# Patient Record
Sex: Male | Born: 1985 | Race: White | Hispanic: No | Marital: Married | Smoking: Current every day smoker
Health system: Southern US, Community
[De-identification: ages and names within clinical notes are randomized; demographics above are authoritative.]

---

## 2014-04-20 ENCOUNTER — Emergency Department: Payer: Self-pay | Admitting: Emergency Medicine

## 2014-12-22 ENCOUNTER — Emergency Department: Payer: Self-pay | Admitting: Emergency Medicine

## 2014-12-26 ENCOUNTER — Emergency Department (HOSPITAL_COMMUNITY)
Admission: EM | Admit: 2014-12-26 | Discharge: 2014-12-26 | Disposition: A | Discharge status: Home / Self Care | Payer: Self-pay | Attending: Emergency Medicine | Admitting: Emergency Medicine

## 2014-12-26 ENCOUNTER — Encounter (HOSPITAL_COMMUNITY): Payer: Self-pay | Admitting: Neurology

## 2014-12-26 DIAGNOSIS — K2970 Gastritis, unspecified, without bleeding: Secondary | ICD-10-CM | POA: Insufficient documentation

## 2014-12-26 DIAGNOSIS — Z720 Tobacco use: Secondary | ICD-10-CM | POA: Insufficient documentation

## 2014-12-26 DIAGNOSIS — R062 Wheezing: Secondary | ICD-10-CM | POA: Insufficient documentation

## 2014-12-26 LAB — URINALYSIS, ROUTINE W REFLEX MICROSCOPIC
Bilirubin Urine: NEGATIVE
GLUCOSE, UA: NEGATIVE mg/dL
Hgb urine dipstick: NEGATIVE
Ketones, ur: NEGATIVE mg/dL
Leukocytes, UA: NEGATIVE
NITRITE: NEGATIVE
Protein, ur: NEGATIVE mg/dL
Specific Gravity, Urine: 1.03 (ref 1.005–1.030)
Urobilinogen, UA: 1 mg/dL (ref 0.0–1.0)
pH: 6.5 (ref 5.0–8.0)

## 2014-12-26 LAB — CBC WITH DIFFERENTIAL/PLATELET
BASOS ABS: 0 10*3/uL (ref 0.0–0.1)
Basophils Relative: 0 % (ref 0–1)
Eosinophils Absolute: 0.3 10*3/uL (ref 0.0–0.7)
Eosinophils Relative: 3 % (ref 0–5)
HCT: 48.7 % (ref 39.0–52.0)
Hemoglobin: 16.7 g/dL (ref 13.0–17.0)
LYMPHS PCT: 24 % (ref 12–46)
Lymphs Abs: 2.6 10*3/uL (ref 0.7–4.0)
MCH: 29.5 pg (ref 26.0–34.0)
MCHC: 34.3 g/dL (ref 30.0–36.0)
MCV: 85.9 fL (ref 78.0–100.0)
Monocytes Absolute: 1 10*3/uL (ref 0.1–1.0)
Monocytes Relative: 10 % (ref 3–12)
Neutro Abs: 6.9 10*3/uL (ref 1.7–7.7)
Neutrophils Relative %: 63 % (ref 43–77)
Platelets: 161 10*3/uL (ref 150–400)
RBC: 5.67 MIL/uL (ref 4.22–5.81)
RDW: 13 % (ref 11.5–15.5)
WBC: 10.8 10*3/uL — AB (ref 4.0–10.5)

## 2014-12-26 LAB — COMPREHENSIVE METABOLIC PANEL
ALBUMIN: 3.8 g/dL (ref 3.5–5.2)
ALT: 60 U/L — AB (ref 0–53)
AST: 32 U/L (ref 0–37)
Alkaline Phosphatase: 66 U/L (ref 39–117)
Anion gap: 8 (ref 5–15)
BUN: 13 mg/dL (ref 6–23)
CO2: 29 mmol/L (ref 19–32)
Calcium: 9.3 mg/dL (ref 8.4–10.5)
Chloride: 104 mmol/L (ref 96–112)
Creatinine, Ser: 0.93 mg/dL (ref 0.50–1.35)
GFR calc Af Amer: >90 mL/min (ref >90)
Glucose, Bld: 99 mg/dL (ref 70–99)
Potassium: 3.8 mmol/L (ref 3.5–5.1)
SODIUM: 141 mmol/L (ref 135–145)
TOTAL PROTEIN: 7 g/dL (ref 6.0–8.3)
Total Bilirubin: 0.7 mg/dL (ref 0.3–1.2)

## 2014-12-26 LAB — LIPASE, BLOOD: LIPASE: 25 U/L (ref 11–59)

## 2014-12-26 MED ORDER — OMEPRAZOLE 20 MG PO CPDR: 20.0000 mg | DELAYED_RELEASE_CAPSULE | Freq: Every day | ORAL | Status: DC

## 2014-12-26 MED ORDER — ONDANSETRON HCL 4 MG PO TABS: 4.0000 mg | ORAL_TABLET | Freq: Four times a day (QID) | ORAL | Status: DC

## 2014-12-26 MED ORDER — IPRATROPIUM BROMIDE 0.02 % IN SOLN
0.5000 mg | Freq: Once | RESPIRATORY_TRACT | Status: AC
  Administered 2014-12-26: 0.5 mg via RESPIRATORY_TRACT
  Filled 2014-12-26: qty 2.5

## 2014-12-26 MED ORDER — PANTOPRAZOLE SODIUM 40 MG IV SOLR
40.0000 mg | INTRAVENOUS | Status: AC
  Administered 2014-12-26: 40 mg via INTRAVENOUS
  Filled 2014-12-26: qty 40

## 2014-12-26 MED ORDER — ALBUTEROL SULFATE (2.5 MG/3ML) 0.083% IN NEBU
5.0000 mg | INHALATION_SOLUTION | Freq: Once | RESPIRATORY_TRACT | Status: AC
  Administered 2014-12-26: 5 mg via RESPIRATORY_TRACT
  Filled 2014-12-26: qty 6

## 2014-12-26 MED ORDER — DIPHENHYDRAMINE HCL 50 MG/ML IJ SOLN
25.0000 mg | Freq: Once | INTRAMUSCULAR | Status: AC
  Administered 2014-12-26: 25 mg via INTRAVENOUS
  Filled 2014-12-26: qty 1

## 2014-12-26 MED ORDER — KETOROLAC TROMETHAMINE 30 MG/ML IJ SOLN
30.0000 mg | Freq: Once | INTRAMUSCULAR | Status: AC
  Administered 2014-12-26: 30 mg via INTRAVENOUS
  Filled 2014-12-26: qty 1

## 2014-12-26 MED ORDER — PROCHLORPERAZINE EDISYLATE 5 MG/ML IJ SOLN
10.0000 mg | Freq: Once | INTRAMUSCULAR | Status: AC
  Administered 2014-12-26: 10 mg via INTRAVENOUS
  Filled 2014-12-26: qty 2

## 2014-12-26 MED ORDER — SODIUM CHLORIDE 0.9 % IV BOLUS (SEPSIS)
1000.0000 mL | INTRAVENOUS | Status: AC
  Administered 2014-12-26: 1000 mL via INTRAVENOUS

## 2014-12-26 NOTE — ED Notes (Addendum)
NP Tysinger at bedside.

## 2014-12-26 NOTE — Discharge Instructions (Signed)
Please follow the directions provided. Use the resource guide or the referral given to establish care with a primary care doctor. Take the omeprazole daily to help prevent this pain returning.  You may take it up to twice a day.  Use the zofran as needed for nausea.  Don't hesitate to return for any new, worsening or concerning symptoms.     SEEK IMMEDIATE MEDICAL CARE IF:  You have black or dark red stools.  You vomit blood or material that looks like coffee grounds.  You are unable to keep fluids down.  Your abdominal pain gets worse.  You have a fever.  You do not feel better after 1 week.  You have any other questions or concerns.   Emergency Department Resource Guide 1) Find a Doctor and Pay Out of Pocket Although you won't have to find out who is covered by your insurance plan, it is a good idea to ask around and get recommendations. You will then need to call the office and see if the doctor you have chosen will accept you as a new patient and what types of options they offer for patients who are self-pay. Some doctors offer discounts or will set up payment plans for their patients who do not have insurance, but you will need to ask so you aren't surprised when you get to your appointment.  2) Contact Your Local Health Department Not all health departments have doctors that can see patients for sick visits, but many do, so it is worth a call to see if yours does. If you don't know where your local health department is, you can check in your phone book. The CDC also has a tool to help you locate your state's health department, and many state websites also have listings of all of their local health departments.  3) Find a Walk-in Clinic If your illness is not likely to be very severe or complicated, you may want to try a walk in clinic. These are popping up all over the country in pharmacies, drugstores, and shopping centers. They're usually staffed by nurse practitioners or physician  assistants that have been trained to treat common illnesses and complaints. They're usually fairly quick and inexpensive. However, if you have serious medical issues or chronic medical problems, these are probably not your best option.  No Primary Care Doctor: - Call Health Connect at  [phone] - they can help you locate a primary care doctor that  accepts your insurance, provides certain services, etc. - Physician Referral Service- [phone]  Chronic Pain Problems: Organization         Address  Phone   Notes  [facility]  [phone] Patients need to be referred by their primary care doctor.   Medication Assistance: Organization         Address  Phone   Notes  [facility] Medication [facility] [address] [place], [place] [address] [phone] --Must be a resident of [facility] -- Must have NO insurance coverage whatsoever (no Medicaid/ Medicare, etc.) -- The pt. MUST have a primary care doctor that directs their care regularly and follows them in the community   [facility]  [phone]   [employer]  [phone]    Agencies that provide inexpensive medical care: Organization         Address  Phone   Notes  [facility]  [phone]   [facility]    (  [phone]) [phone]   [facility] [address] [place], [place] [address] [phone]) [phone]   [facility] [address], [place] [phone](336) [phone]   Planned Parenthood    [phone](336) [phone]   [facility]    [phone]   [facility] and [facility]  [address], [place] Phone:  [phone](336) [phone], Fax:  [phone]) [phone] Hours of Operation:  9 am - 6 pm, M-F.  Also accepts Medicaid/Medicare and self-pay.  [facility]  [address], [place] Phone: [phone](336) [phone], Fax: [phone]) [phone]. Hours of Operation:  8:30 am - 5:30 pm, M-F.  Also accepts  Medicaid and self-pay.  [facility] [place] [address], [place] Phone: [phone](336) [phone]   [facility] [address], [place], [place] [phone], Ext. [phone] Mondays & Thursdays: 7-9 AM.  First 15 patients are seen on a first come, first serve basis.    Medicaid-accepting [facility] Providers:  Organization         Address  Phone   Notes  [facility] [address], [place] [phone]) [phone] Also accepts self-pay patients.  [facility] [address], [place]  [phone]) [phone]   [facility] [address], [place] [phone]) [phone]   [facility] [address] [place] [address], [place] [phone](336) [phone]   Renaye RakersVeita Bland [address], [place]   [phone]) [phone] Only accepts [place] Access [place] patients after they have their name applied to their card.   Self-Pay (no insurance) in [facility]:  Organization         Address  Phone   Notes  Sickle Cell Patients, [facility] [address], [place] [phone]) [phone]   [facility] [address], [place] [phone]   [facility]  [address], [place] [phone]) [phone]   [facility]/Dr. Osei-Bonsu  [address] [place] [address], [place] or [phone], [place] [phone]) [phone] Phone number for both [place] and [place] locations is the same.  [facility] [address], [place] [phone](336) [phone]   [facility]  [address] [place] [address], [place] or [address] [phone]) [phone]   [facility] [address], [place] [phone](336) [phone], phone; [phone](336) [phone], fax Sees patients 1st and 3rd Saturday of every month.  Must not qualify for public or private insurance (i.e. Medicaid, Medicare, [place] Health Choice, Veterans' Benefits)  Household  income should be no more than 200% of the poverty level The clinic cannot treat you if you are pregnant or think you are pregnant  Sexually transmitted diseases are not treated at the clinic.    Dental Care: Organization         Address  Phone  Notes  [facility] [address], [place] [phone]) [phone] Accepts children up to age 29 who are enrolled in [place] or [place] Health Choice; pregnant women with a Medicaid card; and children who have applied for Medicaid or  Health Choice, but were declined, whose parents can pay a reduced fee at time of service.  [facility] [facility] of [facility] [place]  [address], [place] [phone]) [phone] Accepts children up  to age 59 who are enrolled in Medicaid or [place] Health Choice; pregnant women with a Medicaid card; and children who have applied for Medicaid or [place] Health Choice, but were declined, whose parents can pay a reduced fee at time of service.  [facility]  [address] [place], [place] [phone] Patients are seen by appointment only. Walk-ins are not accepted. [facility] will see patients 72 years of age and older. Monday - Tuesday (8am-5pm) Most Wednesdays (8:30-5pm) $30 per visit, cash only  [facility]  [address], [facility] [phone] Patients are seen by appointment only. Walk-ins are not accepted. [facility] will see patients 39 years of age and older. One Wednesday Evening (Monthly: Volunteer Based).  $30 per visit, cash only  [employer]  [phone] for adults; Children under age 60, call Graduate Pediatric Dentistry at [phone]. Children aged 93-14, please call [phone] to request a pediatric application.  Dental services are provided in all areas of dental care including fillings, crowns and bridges, complete and partial dentures, implants, gum  treatment, root canals, and extractions. Preventive care is also provided. Treatment is provided to both adults and children. Patients are selected via a lottery and there is often a waiting list.   [facility] [address], [place]  [phone] [email or web address]   [facility] [address] [place], [place] [phone], Ext. [phone] Second and Fourth Thursday of each month, opens at 6:30 AM; Clinic ends at 9 AM.  Patients are seen on a first-come first-served basis, and a limited number are seen during each clinic.   [facility]  [address] [place], [place] [phone]   Eligibility Requirements You must have lived in [place], [place], or [place] counties for at least the last three months.   You cannot be eligible for state or federal sponsored [place], including [employer], [place], or [employer].   You generally cannot be eligible for healthcare insurance through your employer.    How to apply: Eligibility screenings are held every Tuesday and Wednesday afternoon from 1:00 pm until 4:00 pm. You do not need an appointment for the interview!  [facility] [address], [place], [place] [phone]   [facility]  [phone]   [facility]  [phone]   [facility] Health Department  [phone]    Behavioral Health Resources in the Community: Intensive Outpatient Programs Organization         Address  Phone  Notes  [facility] [address] [place], [place] [phone]   [facility] Outpatient [address], [place], [place] [phone]   [facility] [address] [place], [place]  [phone]   [facility] [address]  [place], [place] [phone] or [phone]   Substance Abuse Resources Organization         Address  Phone  Notes  Alcohol and  Drug Services  [phone]   [facility]  [phone]   [facility] [place]  [phone]   Floydene Flock  [phone]   Residential & Outpatient Substance Abuse Program  5107733744   Psychological Services Organization         Address  Phone  Notes  [facility]  [phone]   [place] [facility]  [phone]   [facility]  [address], [place] [phone] or [phone]    Mobile Crisis Teams Organization         Address  Phone  Notes  [facility], Mobile Crisis Care Unit  [phone]   [facility]  [address] [place], [place] [phone]   Doristine Locks [address] [place] [phone]    Self-Help/Support Groups Organization         Address  Phone             Notes  [facility] [place] - variety of support groups  336- I7437963 Call for more information  Narcotics Anonymous (NA), [facility] [address], [employer] [place]  2 meetings at this location   Statistician         Address  Phone  Notes  [facility] [address],    [place]  [phone]   [facility]  [address], [place] [address], [place], [place] [phone]   [facility] [address] [place], [place] [phone] Admissions: 8am-3pm M-F  [facility] [address]    [place], [place] [phone]   [facility] [address] [place], [place], [place] [phone]   [facility] [address],  [place], [place] [phone]   [facility] - Intensive Outpatient [address], [place], [place] [phone]   [facility] ([facility]iction Recovery [facility] [address]  [place], [place] [phone] or [phone]   [facility] ([facility]) [address], [place], [place] [phone] Accepts Medicaid  [facility]  [place] [address],  [place] [phone] Substance Abuse/Addiction Treatment   [facility] Organization         Address  Phone  Notes  [facility]  [phone]   Angie Fava, PhD [address]vin Knack [place], [place]   [phone] or [phone]   [facility]   [address] [place], [place] [phone]   [facility] [address], [place], [place] [phone] Insurance/Medicaid/sponsorship through [facility] [address]                                    [place], [place] [phone] Therapy/tele-psych/case  [facility] [address] [place], [place] [phone]    Dr. Lolly Mustache  [phone]   [facility]  [employer] [facility] Dept. 1) [address] [place] 2) [address], [place] 3)  [address], [place] [phone]  [phone]   [facility] Child Abuse Hotline 404-433-1481 or 9560193813 (After Hours)

## 2014-12-26 NOTE — ED Notes (Signed)
Pt c/o epigastric pain for 1 week intermittently; n/v, no diarrhea. Dx with acid reflux at [place]. Pt is a x 4

## 2014-12-26 NOTE — ED Provider Notes (Signed)
CSN: [id number]     Arrival date & time 12/26/14  0815 History   First MD Initiated Contact with Patient 12/26/14 [phone]     Chief Complaint  Patient presents with  . Abdominal Pain   (Consider location/radiation/quality/duration/timing/severity/associated sxs/prior Treatment) HPI  Brandon Cruz is a 29 yo male presenting with report of epigastric pain and vomiting.  Pt states his symptoms began 1 week ago when he woke up at 4 am with vomiting followed subsequently by the epigastric abd pain.  He reports vomiting appr 20 times the first day followed continued pain and vomiting until 2 days ago.  His vomiting has stopped but his abd pain continues. He rates his pain 8/10 and describes it as a burning sensation that does not radiate.  He reports drinking 2- 6 packs of beer every weekend and smokes 1 ppd of cigarettes. He denies any fever, chills, bilious/bloody emesis or diarrhea.    History reviewed. No pertinent past medical history. History reviewed. No pertinent past surgical history. No family history on file. History  Substance Use Topics  . Smoking status: Current Every Day Smoker  . Smokeless tobacco: Not on file  . Alcohol Use: Yes    Review of Systems  Constitutional: Negative for fever and chills.  HENT: Negative for sore throat.   Eyes: Negative for visual disturbance.  Respiratory: Negative for cough and shortness of breath.   Cardiovascular: Negative for chest pain and leg swelling.  Gastrointestinal: Positive for nausea, vomiting and abdominal pain. Negative for diarrhea.  Genitourinary: Negative for dysuria.  Musculoskeletal: Negative for myalgias.  Skin: Negative for rash.  Neurological: Negative for weakness, numbness and headaches.      Allergies  Review of patient's allergies indicates no known allergies.  Home Medications   Prior to Admission medications   Not on File   BP 103/60 mmHg  Pulse 66  Temp(Src) 97.6 F (36.4 C) (Oral)  Resp 19  Ht 5\' 9"   (1.753 m)  Wt 240 lb (108.863 kg)  BMI 35.43 kg/m2  SpO2 95% Physical Exam  Constitutional: He appears well-developed and well-nourished. No distress.  HENT:  Head: Normocephalic and atraumatic.  Mouth/Throat: Oropharynx is clear and moist. No oropharyngeal exudate.  Eyes: Conjunctivae are normal.  Neck: Neck supple. No thyromegaly present.  Cardiovascular: Normal rate, regular rhythm and intact distal pulses.   Pulmonary/Chest: Effort normal. No respiratory distress. He has no decreased breath sounds. He has wheezes in the right middle field, the right lower field, the left middle field and the left lower field. He has no rhonchi. He has no rales. He exhibits no tenderness.  Abdominal: Soft. He exhibits no distension and no mass. There is no hepatosplenomegaly. There is tenderness in the epigastric area. There is no rigidity, no rebound, no guarding, no CVA tenderness, no tenderness at McBurney's point and negative Murphy's sign.    Musculoskeletal: He exhibits no tenderness.  Lymphadenopathy:    He has no cervical adenopathy.  Neurological: He is alert.  Skin: Skin is warm and dry. No rash noted. He is not diaphoretic.  Psychiatric: He has a normal mood and affect.  Nursing note and vitals reviewed.   ED Course  Procedures (including critical care time) Labs Review Labs Reviewed  CBC WITH DIFFERENTIAL/PLATELET - Abnormal; Notable for the following:    WBC 10.8 (*)    All other components within normal limits  COMPREHENSIVE METABOLIC PANEL - Abnormal; Notable for the following:    ALT 60 (*)  All other components within normal limits  URINALYSIS, ROUTINE W REFLEX MICROSCOPIC - Abnormal; Notable for the following:    Color, Urine AMBER (*)    All other components within normal limits  LIPASE, BLOOD    Imaging Review No results found.   EKG Interpretation None      MDM   Final diagnoses:  Gastritis   29 yo with symptoms epigastric pain and vomiting, symptoms  consistent with gastritis.  Vitals are stable, no fever. Labs negative for significant abnormality.  Pt's pain and nausea resolved after NS bolus, toradol for pain, IV protonix and nausea meds. Pt tolerating PO fluids.  Discussed starting PPI daily and resources to establish care with a PCP.  Supportive therapy indicated with return if symptoms worsen.  Pt is well-appearing, in no acute distress and vital signs reviewed and not concerning. He appears safe to be discharged.  Return precautions provided.  Pt aware of plan and in agreement.     Filed Vitals:   12/26/14 1000 12/26/14 1015 12/26/14 1030 12/26/14 1045  BP: 112/63 104/74 108/69   Pulse:  52    Temp:    98 F (36.7 C)  TempSrc:    Oral  Resp: Height:      Weight:      SpO2: 99% 98% 100%    Meds given in ED:  Medications  sodium chloride 0.9 % bolus 1,000 mL (0 mLs Intravenous Stopped 12/26/14 1040)  ketorolac (TORADOL) 30 MG/ML injection 30 mg (30 mg Intravenous Given 12/26/14 0927)  diphenhydrAMINE (BENADRYL) injection 25 mg (25 mg Intravenous Given 12/26/14 0928)  prochlorperazine (COMPAZINE) injection 10 mg (10 mg Intravenous Given 12/26/14 0930)  pantoprazole (PROTONIX) injection 40 mg (40 mg Intravenous Given 12/26/14 0925)  albuterol (PROVENTIL) (2.5 MG/3ML) 0.083% nebulizer solution 5 mg (5 mg Nebulization Given 12/26/14 0936)  ipratropium (ATROVENT) nebulizer solution 0.5 mg (0.5 mg Nebulization Given 12/26/14 0936)    12/26/14 0000  omeprazole (PRILOSEC) 20 MG capsule Daily Discontinue Reprint 12/26/14 1110   12/26/14 0000  ondansetron (ZOFRAN) 4 MG tablet Every 6 hours Discontinue Reprint 12/26/14 [address] NP 12/26/14 2321  Azalia Bilis, MD 12/28/14 [phone]

## 2015-04-20 ENCOUNTER — Emergency Department
Admission: EM | Admit: 2015-04-20 | Discharge: 2015-04-20 | Disposition: A | Discharge status: Home / Self Care | Payer: BLUE CROSS/BLUE SHIELD | Attending: Emergency Medicine | Admitting: Emergency Medicine

## 2015-04-20 DIAGNOSIS — Z720 Tobacco use: Secondary | ICD-10-CM | POA: Diagnosis not present

## 2015-04-20 DIAGNOSIS — J029 Acute pharyngitis, unspecified: Secondary | ICD-10-CM | POA: Diagnosis present

## 2015-04-20 DIAGNOSIS — Z79899 Other long term (current) drug therapy: Secondary | ICD-10-CM | POA: Insufficient documentation

## 2015-04-20 DIAGNOSIS — J020 Streptococcal pharyngitis: Secondary | ICD-10-CM | POA: Diagnosis not present

## 2015-04-20 MED ORDER — ACETAMINOPHEN-CODEINE 120-12 MG/5ML PO SUSP: 10.0000 mL | Freq: Four times a day (QID) | ORAL | Status: DC | PRN

## 2015-04-20 MED ORDER — ACETAMINOPHEN-CODEINE 120-12 MG/5ML PO SOLN
10.0000 mL | Freq: Once | ORAL | Status: AC
  Administered 2015-04-20: 10 mL via ORAL

## 2015-04-20 MED ORDER — ACETAMINOPHEN-CODEINE 120-12 MG/5ML PO SOLN
ORAL | Status: AC
  Administered 2015-04-20: 10 mL via ORAL
  Filled 2015-04-20: qty 2

## 2015-04-20 MED ORDER — PENICILLIN G BENZATHINE 1200000 UNIT/2ML IM SUSP
1.2000 10*6.[IU] | Freq: Once | INTRAMUSCULAR | Status: AC
  Administered 2015-04-20: 1.2 10*6.[IU] via INTRAMUSCULAR
  Filled 2015-04-20: qty 2

## 2015-04-20 NOTE — ED Provider Notes (Signed)
[facility] Emergency Department Provider Note  ____________________________________________  Time seen: Approximately 8:40 PM  I have reviewed the triage vital signs and the nursing notes.   HISTORY  Chief Complaint Sore Throat   HPI Brandon Cruz is a 29 y.o. male who presents to the emergency department for sore throat that started on Monday and has gotten much worse over the past 2 days. He c/o pain with swallowing and chills. No confirmed fever.   No past medical history on file.  There are no active problems to display for this patient.   No past surgical history on file.  Current Outpatient Rx  Name  Route  Sig  Dispense  Refill  . acetaminophen-codeine 120-12 MG/5ML suspension   Oral   Take 10 mLs by mouth every 6 (six) hours as needed for pain.   120 mL   0   . omeprazole (PRILOSEC) 20 MG capsule   Oral   Take 1 capsule (20 mg total) by mouth daily.   30 capsule   0   . ondansetron (ZOFRAN) 4 MG tablet   Oral   Take 1 tablet (4 mg total) by mouth every 6 (six) hours.   12 tablet   0     Allergies Review of patient's allergies indicates no known allergies.  No family history on file.  Social History History  Substance Use Topics  . Smoking status: Current Every Day Smoker  . Smokeless tobacco: Not on file  . Alcohol Use: Yes    Review of Systems Constitutional:Feverno Eyes: No visual changes. ENT: Sore throat.yes, Difficulty Swallowing yes Respiratory: Denies shortness of breath. Gastrointestinal: No abdominal pain.  No nausea, no vomiting.  No diarrhea. Genitourinary: Negative for dysuria. Musculoskeletal:Generalized body aches: no Skin: Rash: no  Neurological: Negative for headaches, focal weakness or numbness.  10-point ROS otherwise negative.  ____________________________________________   PHYSICAL EXAM:  VITAL SIGNS: ED Triage Vitals  Enc Vitals Group     BP 04/20/15 2018 120/81 mmHg     Pulse Rate  04/20/15 2018 100     Resp 04/20/15 2018 18     Temp 04/20/15 2018 98.4 F (36.9 C)     Temp Source 04/20/15 2018 Oral     SpO2 04/20/15 2018 96 %     Weight 04/20/15 2018 235 lb (106.595 kg)     Height 04/20/15 2018  (1.753 m)     Head Cir --      Peak Flow --      Pain Score 04/20/15 2020 4     Pain Loc --      Pain Edu? --      Excl. in GC? --     Constitutional: Alert and oriented. Well appearing and in no acute distress. Eyes: Conjunctivae are normal. PERRL. EOMI. Head: Atraumatic. Nose: No congestion/rhinnorhea. Mouth/Throat: Mucous membranes are moist.  Oropharynx erythematous with tonsillar exudate and purulent pustules.  Neck: No stridor.  Lymphatic: Lymphadenopathy: no Cardiovascular: Normal rate, regular rhythm. Good peripheral circulation. Respiratory: Normal respiratory effort. Lungs CTAB. Gastrointestinal: Soft and nontender. Musculoskeletal: No lower extremity tenderness nor edema.   Neurologic:  Normal speech and language. No gross focal neurologic deficits are appreciated. Speech is normal. No gait instability. Skin:  Skin is warm, dry and intact. No rash noted Psychiatric: Mood and affect are normal. Speech and behavior are normal.  ____________________________________________   LABS (all labs ordered are listed, but only abnormal results are displayed)  Labs Reviewed - No data  to display ____________________________________________  EKG   ____________________________________________  RADIOLOGY   ____________________________________________   PROCEDURES  Procedure(s) performed: None  Critical Care performed: No  ____________________________________________   INITIAL IMPRESSION / ASSESSMENT AND PLAN / ED COURSE  Pertinent labs & imaging results that were available during my care of the patient were reviewed by me and considered in my medical decision making (see chart for details).  Patient elected to take Bicillin injection. Will  also give Tylenol #3 suspension for pain. He was advised to follow up with ENT for symptoms not improving over the next 2-3 days. ____________________________________________   FINAL CLINICAL IMPRESSION(S) / ED DIAGNOSES  Final diagnoses:  Acute streptococcal pharyngitis     Chinita PesterCari B [name], FNP 04/20/15 2120  Darien Ramusavid W Kaminski, MD 04/20/15 2358

## 2015-04-20 NOTE — ED Notes (Signed)
Pt states sore throat for 2 days, pt states is painful to swallow, pt with strong cough noted in triage. Pt states has had chills.

## 2015-04-20 NOTE — ED Notes (Signed)
Patient with no complaints at this time. Respirations even and unlabored. Skin warm/dry. Discharge instructions reviewed with patient at this time. Patient given opportunity to voice concerns/ask questions. Patient discharged at this time and left Emergency Department with steady gait.   

## 2015-04-21 ENCOUNTER — Emergency Department
Admission: EM | Admit: 2015-04-21 | Discharge: 2015-04-21 | Disposition: A | Discharge status: Home / Self Care | Payer: BLUE CROSS/BLUE SHIELD | Attending: Emergency Medicine | Admitting: Emergency Medicine

## 2015-04-21 ENCOUNTER — Encounter: Payer: Self-pay | Admitting: Emergency Medicine

## 2015-04-21 ENCOUNTER — Emergency Department: Payer: BLUE CROSS/BLUE SHIELD

## 2015-04-21 DIAGNOSIS — J36 Peritonsillar abscess: Secondary | ICD-10-CM | POA: Insufficient documentation

## 2015-04-21 DIAGNOSIS — Z79899 Other long term (current) drug therapy: Secondary | ICD-10-CM | POA: Insufficient documentation

## 2015-04-21 DIAGNOSIS — Z720 Tobacco use: Secondary | ICD-10-CM | POA: Diagnosis not present

## 2015-04-21 DIAGNOSIS — J0300 Acute streptococcal tonsillitis, unspecified: Secondary | ICD-10-CM

## 2015-04-21 DIAGNOSIS — J029 Acute pharyngitis, unspecified: Secondary | ICD-10-CM | POA: Diagnosis present

## 2015-04-21 DIAGNOSIS — J039 Acute tonsillitis, unspecified: Secondary | ICD-10-CM

## 2015-04-21 LAB — BASIC METABOLIC PANEL
ANION GAP: 12 (ref 5–15)
BUN: 10 mg/dL (ref 6–20)
CO2: 25 mmol/L (ref 22–32)
Calcium: 8.9 mg/dL (ref 8.9–10.3)
Chloride: 98 mmol/L — ABNORMAL LOW (ref 101–111)
Creatinine, Ser: 0.78 mg/dL (ref 0.61–1.24)
Glucose, Bld: 132 mg/dL — ABNORMAL HIGH (ref 65–99)
POTASSIUM: 3.4 mmol/L — AB (ref 3.5–5.1)
SODIUM: 135 mmol/L (ref 135–145)

## 2015-04-21 LAB — CBC
HCT: 46.3 % (ref 40.0–52.0)
HEMOGLOBIN: 16 g/dL (ref 13.0–18.0)
MCH: 28.8 pg (ref 26.0–34.0)
MCHC: 34.6 g/dL (ref 32.0–36.0)
MCV: 83.4 fL (ref 80.0–100.0)
Platelets: 138 10*3/uL — ABNORMAL LOW (ref 150–440)
RBC: 5.55 MIL/uL (ref 4.40–5.90)
RDW: 13.1 % (ref 11.5–14.5)
WBC: 14.9 10*3/uL — ABNORMAL HIGH (ref 3.8–10.6)

## 2015-04-21 IMAGING — CT CT NECK W/ CM
4 of 5 series · 14 of 33 positions shown, 16 images · IV contrast (omnipaque)
Comparison: None.

CLINICAL DATA: Sore throat x5 days

EXAM:
CT NECK WITH CONTRAST
TECHNIQUE: Multidetector CT imaging of the neck was performed using the
standard protocol following the bolus administration of intravenous
contrast.
CONTRAST:  75mL OMNIPAQUE IOHEXOL 300 MG/ML  SOLN

[Series 2: axial neck · axial · 0.65mm/px · z∈[-224,-60]mm · 4 of 138 slices shown, 5 images]
[im 28/138  soft-tissue]
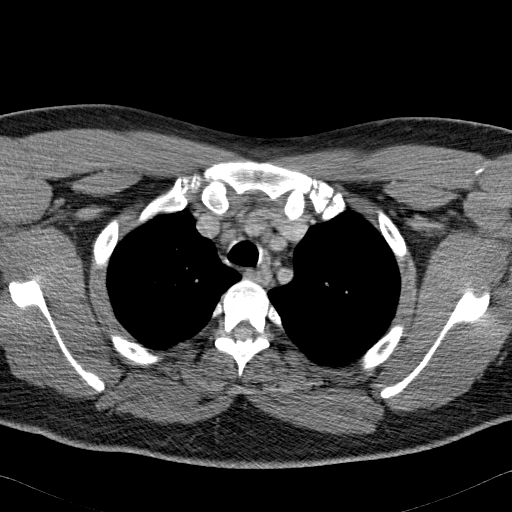
[im 28/138  bone]
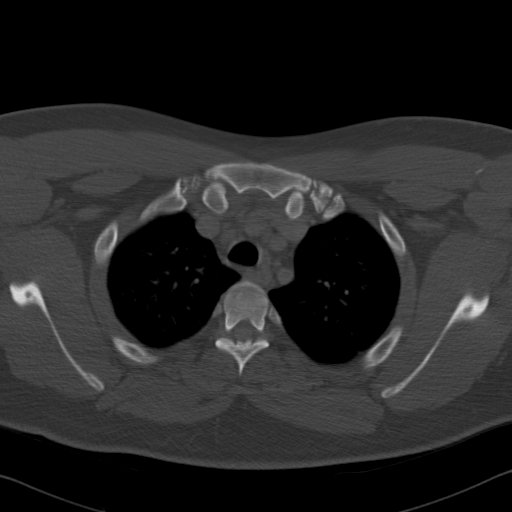
[im 55/138  bone]
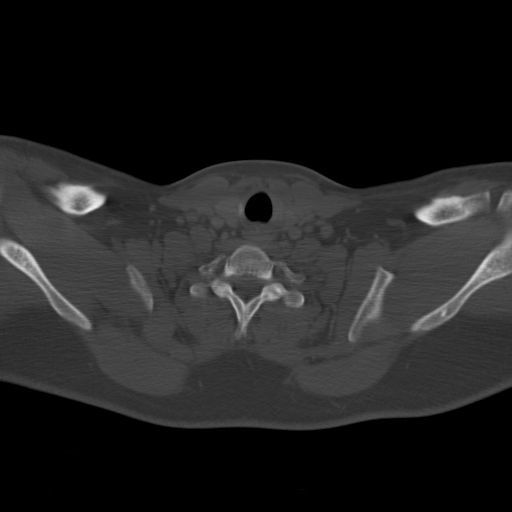
[im 83/138  bone]
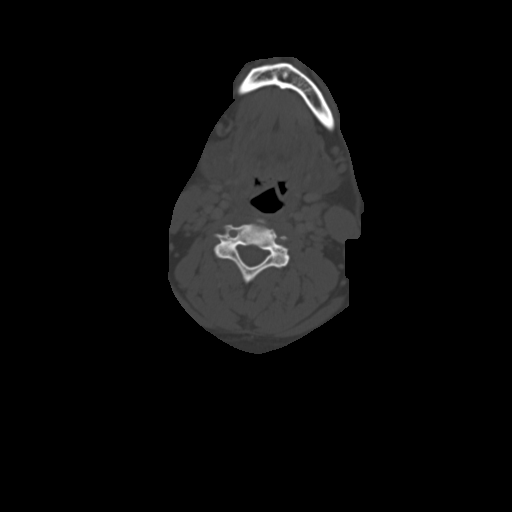
[im 110/138  bone]
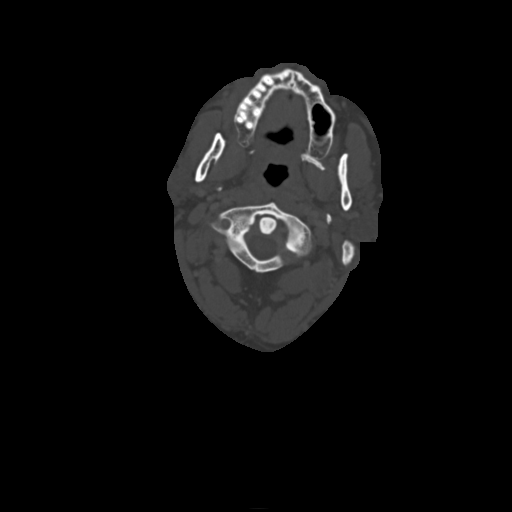

[Series 4: sag neck · sagittal · 0.46mm/px · 5 of 120 slices shown, 6 images]
[im 40/120  bone]
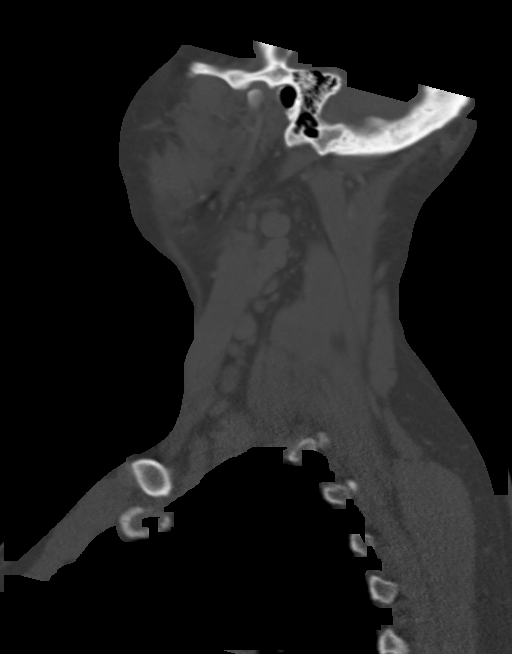
[im 50/120  bone]
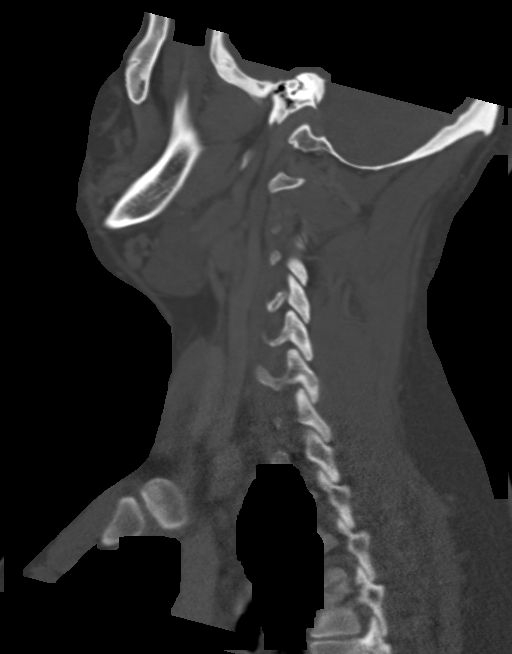
[im 60/120  soft-tissue]
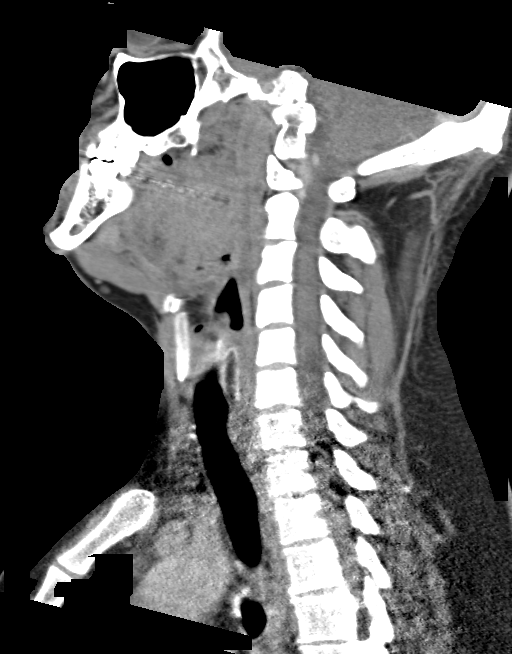
[im 60/120  bone]
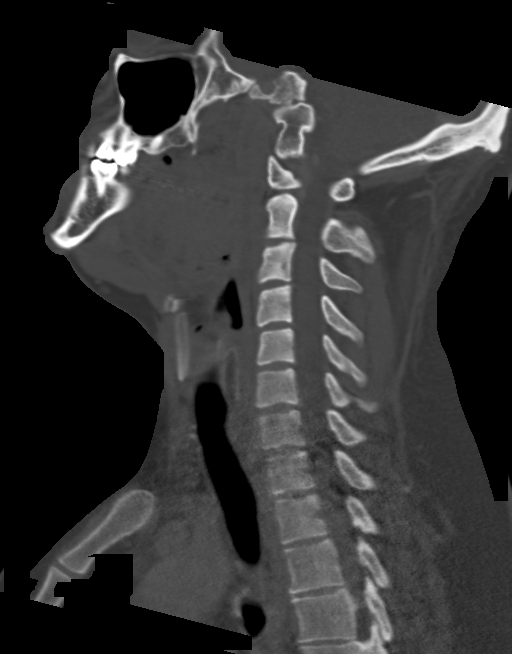
[im 70/120  bone]
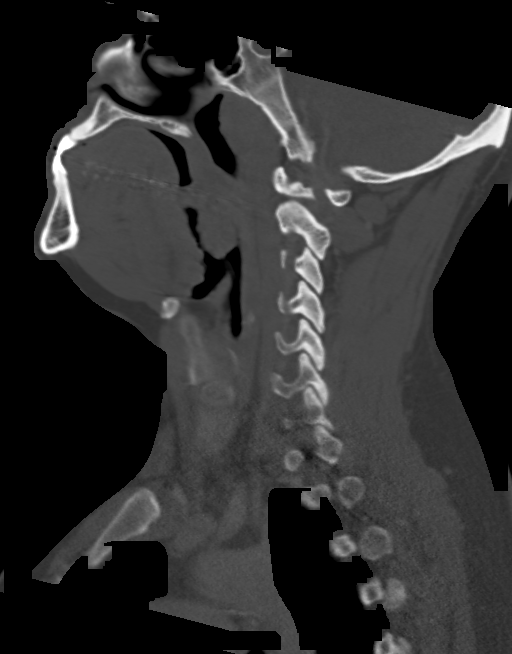
[im 80/120  bone]
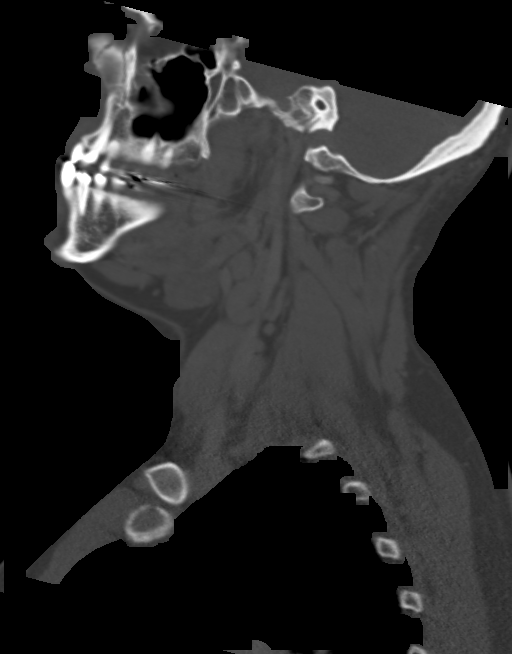

[Series 6: ax oropharynx · axial · 0.45mm/px · z∈[-246,-193]mm · 2 of 143 slices shown]
[im 29/143  bone]
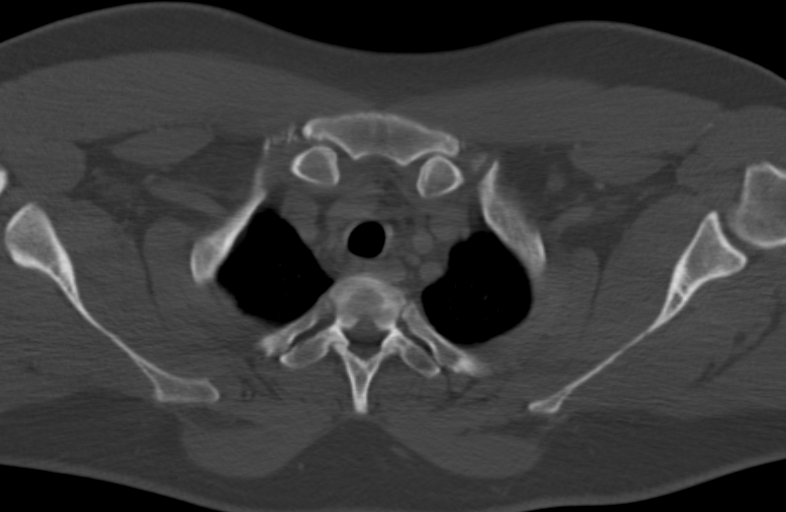
[im 57/143  bone]
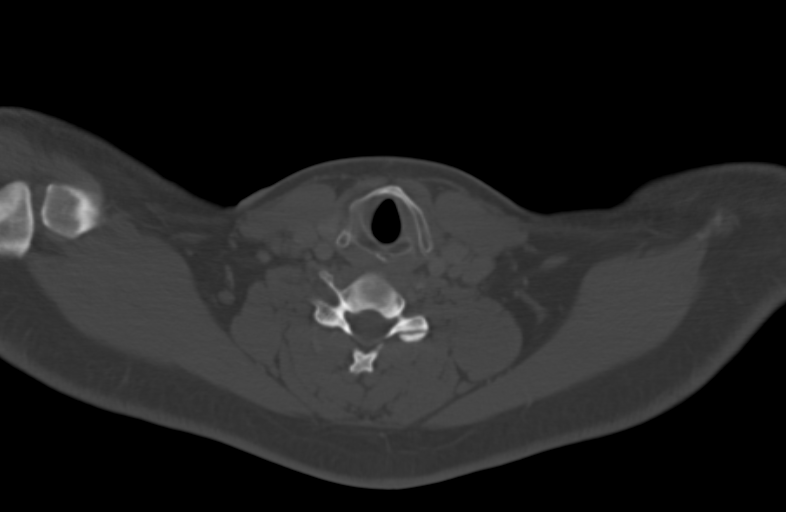

[Series 7: cor neck · coronal · 0.57mm/px · 3 of 107 slices shown]
[im 27/107  bone]
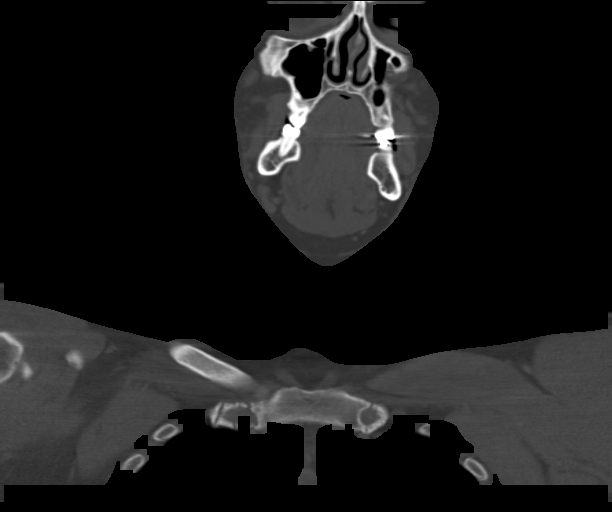
[im 45/107  bone]
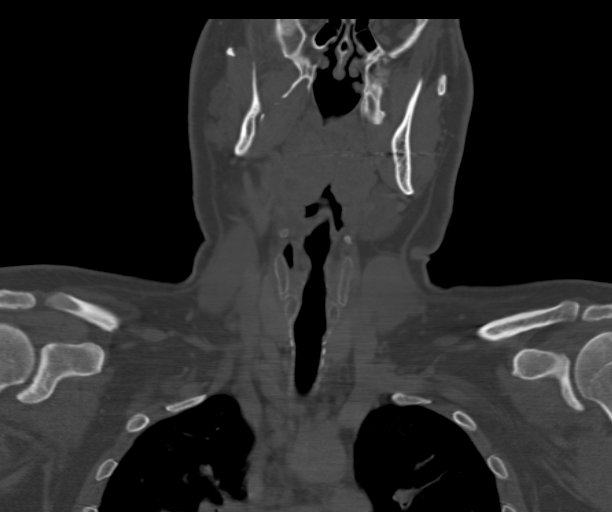
[im 63/107  bone]
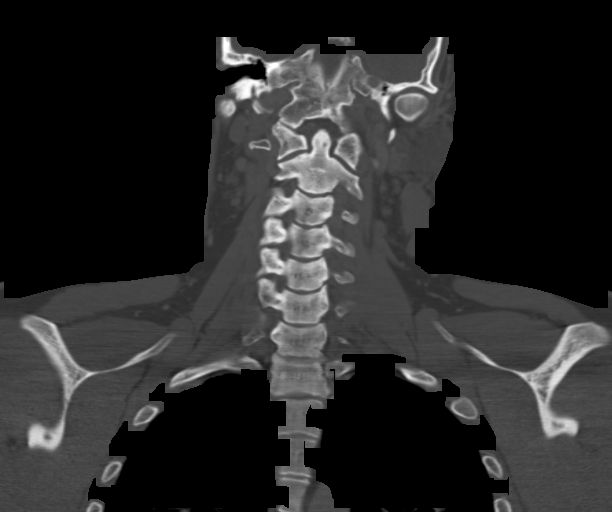

[14 of 33 positions shown; findings below may reference images not displayed]

FINDINGS: Pharynx and larynx: Enlargement/edema of the bilateral tonsils.

9 x 11 mm early/developing right peritonsillar abscess (series 2/
image 46).

Nasopharyngeal airway is narrowed but patent.

Salivary glands: Within normal limits.

Thyroid: Within normal limits.

Lymph nodes: Enlarged bilateral cervical lymphadenopathy, measuring
up to 16 mm on the right and 13 mm on the left, likely reactive.

Vascular: Within normal limits.

Limited intracranial: Within normal limits.

Visualized orbits: Visualize globes and retroconal soft tissues are
within normal limits.

Mastoids and visualized paranasal sinuses: Visualized paranasal
sinuses and mastoid air cells are clear.

Skeleton: Cervical spine is within normal limits.

Upper chest: Visualized lung apices are clear.
IMPRESSION: 9 x 11 mm early/developing right peritonsillar abscess.

## 2015-04-21 MED ORDER — CLINDAMYCIN PALMITATE HCL 75 MG/5ML PO SOLR
300.0000 mg | Freq: Once | ORAL | Status: DC
  Filled 2015-04-21: qty 20

## 2015-04-21 MED ORDER — DEXAMETHASONE SODIUM PHOSPHATE 10 MG/ML IJ SOLN
10.0000 mg | Freq: Once | INTRAMUSCULAR | Status: AC
  Administered 2015-04-21: 10 mg via INTRAVENOUS
  Filled 2015-04-21: qty 1

## 2015-04-21 MED ORDER — AMOXICILLIN 500 MG PO TABS: 1000.0000 mg | ORAL_TABLET | Freq: Three times a day (TID) | ORAL | Status: DC

## 2015-04-21 MED ORDER — LIDOCAINE VISCOUS 2 % MT SOLN: 20.0000 mL | OROMUCOSAL | Status: DC | PRN

## 2015-04-21 MED ORDER — CEFTRIAXONE SODIUM IN DEXTROSE 40 MG/ML IV SOLN
2.0000 g | Freq: Once | INTRAVENOUS | Status: AC
  Administered 2015-04-21: 2 g via INTRAVENOUS
  Filled 2015-04-21: qty 50

## 2015-04-21 MED ORDER — ACETAMINOPHEN 160 MG/5ML PO SUSP
ORAL | Status: AC
  Filled 2015-04-21: qty 20

## 2015-04-21 MED ORDER — LIDOCAINE VISCOUS 2 % MT SOLN
15.0000 mL | Freq: Once | OROMUCOSAL | Status: AC
  Administered 2015-04-21: 15 mL via OROMUCOSAL
  Filled 2015-04-21: qty 15

## 2015-04-21 MED ORDER — IOHEXOL 300 MG/ML  SOLN
75.0000 mL | Freq: Once | INTRAMUSCULAR | Status: AC | PRN
  Administered 2015-04-21: 75 mL via INTRAVENOUS

## 2015-04-21 MED ORDER — SODIUM CHLORIDE 0.9 % IV SOLN
Freq: Once | INTRAVENOUS | Status: AC
  Administered 2015-04-21: 19:00:00 via INTRAVENOUS

## 2015-04-21 MED ORDER — ACETAMINOPHEN 160 MG/5ML PO SOLN
650.0000 mg | Freq: Once | ORAL | Status: AC
  Administered 2015-04-21: 650 mg via ORAL
  Filled 2015-04-21: qty 20.3

## 2015-04-21 MED ORDER — SODIUM CHLORIDE 0.9 % IV SOLN
3.0000 g | Freq: Once | INTRAVENOUS | Status: AC
  Administered 2015-04-21: 3 g via INTRAVENOUS
  Filled 2015-04-21: qty 3

## 2015-04-21 MED ORDER — PREDNISONE 10 MG (48) PO TBPK: ORAL_TABLET | Freq: Every day | ORAL | Status: DC

## 2015-04-21 NOTE — ED Provider Notes (Signed)
[facility] Emergency Department Provider Note  ____________________________________________  Time seen: Approximately 6:45 PM  I have reviewed the triage vital signs and the nursing notes.   HISTORY  Chief Complaint Sore Throat    HPI Brandon Cruz is a 29 y.o. male presents to the ER for the complaint of continued sore throat. Patient reports that he has a sore throat for approximate 5 days. Patient reports that he was seen in the ER yesterday and was positive for strep throat and received a Bicillin shot. Patient reports that he presents to the ER today as he is unable to swallow foods and liquids at home. Patient reports intermittent fever. Patient states that bilateral sides of throat hurts. Denies other pain or other complaints. 8 sore throat is currently 8 out of 10 pain and hurts to swallow.     History reviewed. No pertinent past medical history.  There are no active problems to display for this patient.   History reviewed. No pertinent past surgical history.  Current Outpatient Rx  Name  Route  Sig  Dispense  Refill  . acetaminophen-codeine 120-12 MG/5ML suspension   Oral   Take 10 mLs by mouth every 6 (six) hours as needed for pain.   120 mL   0   . omeprazole (PRILOSEC) 20 MG capsule   Oral   Take 1 capsule (20 mg total) by mouth daily.   30 capsule   0   . ondansetron (ZOFRAN) 4 MG tablet   Oral   Take 1 tablet (4 mg total) by mouth every 6 (six) hours.   12 tablet   0     Allergies Review of patient's allergies indicates no known allergies.  History reviewed. No pertinent family history.  Social History History  Substance Use Topics  . Smoking status: Current Every Day Smoker  . Smokeless tobacco: Not on file  . Alcohol Use: Yes    Review of Systems Constitutional: positive fever/chills Eyes: No visual changes. ENT: positive sore throat. Cardiovascular: Denies chest pain. Respiratory: Denies shortness of  breath. Gastrointestinal: No abdominal pain.  No nausea, no vomiting.  No diarrhea.  No constipation. Genitourinary: Negative for dysuria. Musculoskeletal: Negative for back pain. Skin: Negative for rash. Neurological: Negative for headaches, focal weakness or numbness.  10-point ROS otherwise negative.  ____________________________________________   PHYSICAL EXAM:  VITAL SIGNS: ED Triage Vitals  Enc Vitals Group     BP 04/21/15 1551 120/72 mmHg     Pulse Rate 04/21/15 1551 103     Resp 04/21/15 1551 18     Temp 04/21/15 1551 99.1 F (37.3 C)     Temp Source 04/21/15 1551 Oral     SpO2 04/21/15 1551 94 %     Weight 04/21/15 1551 230 lb (104.327 kg)     Height 04/21/15 1551  (1.753 m)     Head Cir --      Peak Flow --      Pain Score 04/21/15 1552 10     Pain Loc --      Pain Edu? --      Excl. in GC? --    Blood pressure 126/79, pulse 99, temperature 100.5 F (38.1 C), temperature source Oral, resp. rate 18, height  (1.753 m), weight 230 lb (104.327 kg), SpO2 94 %.   Constitutional: Alert and oriented. Well appearing and in no acute distress. Eyes: Conjunctivae are normal. PERRL. EOMI. Head: Atraumatic. Nose: No congestion/rhinnorhea. Ears: no erythema, normal TMs.  Mouth/Throat: Mucous membranes are mildly dry.Lips moist. Mod pharyngeal erythema. Bilateral tonsillar exudate. Left 3+, right 4+. Uvular deviation to left. No drooling.  Neck: No stridor.  No cervical spine tenderness to palpation. Hematological/Lymphatic/Immunilogical: anterior cervical lymphadenopathy. Cardiovascular: Normal rate, regular rhythm. Grossly normal heart sounds.  Good peripheral circulation. Respiratory: Normal respiratory effort.  No retractions. Lungs CTAB. Gastrointestinal: Soft and nontender. No distention. No abdominal bruits. No CVA tenderness. Musculoskeletal: No lower extremity tenderness nor edema.  No joint effusions. Neurologic:  Normal speech and language. No gross  focal neurologic deficits are appreciated. No gait instability. Skin:  Skin is warm, dry and intact. No rash noted. Psychiatric: Mood and affect are normal. Speech and behavior are normal.  ____________________________________________   LABS (all labs ordered are listed, but only abnormal results are displayed)  Labs Reviewed  BASIC METABOLIC PANEL - Abnormal; Notable for the following:    Potassium 3.4 (*)    Chloride 98 (*)    Glucose, Bld 132 (*)    All other components within normal limits  CBC - Abnormal; Notable for the following:    WBC 14.9 (*)    Platelets 138 (*)    All other components within normal limits  CULTURE, BLOOD (ROUTINE X 2)  CULTURE, BLOOD (ROUTINE X 2)   ______________________________________   INITIAL IMPRESSION / ASSESSMENT AND PLAN / ED COURSE  Pertinent labs & imaging results that were available during my care of the patient were reviewed by me and considered in my medical decision making (see chart for details).  Patient presents to the ER for complaints of sore throat which has worsened. Patient reports that he was seen in the ER yesterday and positive for strep throat and received antibiotic shot. Presents today with difficulty eating and drinking. Patient with bilateral exudative tonsils and bilateral swelling. Patient's right tonsil larger. Concern for right tonsillar abscess. We'll evaluate by CT. Discussed this with patient and significant other. Will start normal saline and 10 mg IV Decadron.  Care transferred to Dr. Scotty CourtStafford. Patient moved to room 14. Dr. Scotty CourtStafford to resume care, monitor and follow-up with patient. Patient and family verbalized understanding and agreed to plan.  ____________________________________________   FINAL CLINICAL IMPRESSION(S) / ED DIAGNOSES  Final diagnoses:  Streptococcal tonsillitis  Exudative tonsillitis      Renford DillsLindsey Lindey Renzulli, NP 04/21/15 1857  Sharman CheekPhillip Stafford, MD 04/21/15 2054

## 2015-04-21 NOTE — ED Notes (Signed)
Patient reports he was here last night for strep throat returns today with report increasing soreness to throat, unable to swallow medications.

## 2015-04-21 NOTE — ED Notes (Signed)
Pt at CT at this time.

## 2015-04-21 NOTE — Discharge Instructions (Signed)
Pharyngitis Pharyngitis is redness, pain, and swelling (inflammation) of your pharynx.  CAUSES  Pharyngitis is usually caused by infection. Most of the time, these infections are from viruses (viral) and are part of a cold. However, sometimes pharyngitis is caused by bacteria (bacterial). Pharyngitis can also be caused by allergies. Viral pharyngitis may be spread from person to person by coughing, sneezing, and personal items or utensils (cups, forks, spoons, toothbrushes). Bacterial pharyngitis may be spread from person to person by more intimate contact, such as kissing.  SIGNS AND SYMPTOMS  Symptoms of pharyngitis include:   Sore throat.   Tiredness (fatigue).   Low-grade fever.   Headache.  Joint pain and muscle aches.  Skin rashes.  Swollen lymph nodes.  Plaque-like film on throat or tonsils (often seen with bacterial pharyngitis). DIAGNOSIS  Your health care provider will ask you questions about your illness and your symptoms. Your medical history, along with a physical exam, is often all that is needed to diagnose pharyngitis. Sometimes, a rapid strep test is done. Other lab tests may also be done, depending on the suspected cause.  TREATMENT  Viral pharyngitis will usually get better in 3-4 days without the use of medicine. Bacterial pharyngitis is treated with medicines that kill germs (antibiotics).  HOME CARE INSTRUCTIONS   Drink enough water and fluids to keep your urine clear or pale yellow.   Only take over-the-counter or prescription medicines as directed by your health care provider:   If you are prescribed antibiotics, make sure you finish them even if you start to feel better.   Do not take aspirin.   Get lots of rest.   Gargle with 8 oz of salt water ( tsp of salt per 1 qt of water) as often as every 1-2 hours to soothe your throat.   Throat lozenges (if you are not at risk for choking) or sprays may be used to soothe your throat. SEEK MEDICAL  CARE IF:   You have large, tender lumps in your neck.  You have a rash.  You cough up green, yellow-brown, or bloody spit. SEEK IMMEDIATE MEDICAL CARE IF:   Your neck becomes stiff.  You drool or are unable to swallow liquids.  You vomit or are unable to keep medicines or liquids down.  You have severe pain that does not go away with the use of recommended medicines.  You have trouble breathing (not caused by a stuffy nose). MAKE SURE YOU:   Understand these instructions.  Will watch your condition.  Will get help right away if you are not doing well or get worse. Document Released: 09/22/2005 Document Revised: 07/13/2013 Document Reviewed: 05/30/2013 [facility] Patient Information 2015 [place], [place]. This information is not intended to replace advice given to you by your health care provider. Make sure you discuss any questions you have with your health care provider.  Peritonsillar Abscess A peritonsillar abscess is a collection of pus located in the back of the throat behind the tonsils. It usually occurs when a streptococcal infection of the throat or tonsils spreads into the space around the tonsils. They are almost always caused by the streptococcal germ (bacteria). The treatment of a peritonsillar abscess is most often drainage accomplished by putting a needle into the abscess or cutting (incising) and draining the abscess. This is most often followed with a course of antibiotics. HOME CARE INSTRUCTIONS  If your abscess was drained by your caregiver today, rinse your throat (gargle) with warm salt water four times per day  or as needed for comfort. Do not swallow this mixture. Mix 1 teaspoon of salt in 8 ounces of warm water for gargling.  Rest in bed as needed. Resume activities as able.  Apply cold to your neck for pain relief. Fill a plastic bag with ice and wrap it in a towel. Hold the ice on your neck for 20 minutes 4 times per day.  Eat a soft or liquid diet as  tolerated while your throat remains sore. Popsicles and ice cream may be good early choices. Drinking plenty of cold fluids will probably be soothing and help take swelling down in between the warm gargles.  Only take over-the-counter or prescription medicines for pain, discomfort, or fever as directed by your caregiver. Do not use aspirin unless directed by your physician. Aspirin slows down the clotting process. It can also cause bleeding from the drainage area if this was needled or incised today.  If antibiotics were prescribed, take them as directed for the full course of the prescription. Even if you feel you are well, you need to take them. SEEK MEDICAL CARE IF:   You have increased pain, swelling, redness, or drainage in your throat.  You develop signs of infection such as dizziness, headache, lethargy, or generalized feelings of illness.  You have difficulty breathing, swallowing or eating.  You show signs of becoming dehydrated (lightheadedness when standing, decreased urine output, a fast heart rate, or dry mouth and mucous membranes). SEEK IMMEDIATE MEDICAL CARE IF:   You have a fever.  You are coughing up or vomiting blood.  You develop more severe throat pain uncontrolled with medicines or you start to drool.  You develop difficulty breathing, talking, or find it easier to breathe while leaning forward. Document Released: 09/22/2005 Document Revised: 12/15/2011 Document Reviewed: 05/05/2008 [facility] Patient Information 2015 [place]. This information is not intended to replace advice given to you by your health care provider. Make sure you discuss any questions you have with your health care provider.   Your CT scan today shows you have a small peritonsillar abscess. We gave you IV antibiotics in the ER. It is not large enough to require surgical drainage at this time. Use viscous lidocaine as needed for symptom relief. Take the steroids and antibiotics as prescribed  to control this infection. If you have any worsening, called the ENT clinic who can see you right away and determine if drainage is needed. If you're unable to follow up in ENT clinic, return to the ER right away.

## 2015-04-21 NOTE — ED Notes (Signed)
Labs reviewed with MD, OK for patient to go to flex.

## 2015-04-21 NOTE — ED Notes (Signed)
Will start Rocephin once Unasyn completes. Pt verbalized understanding, no further needs at this time.

## 2015-04-23 LAB — POCT RAPID STREP A: Streptococcus, Group A Screen (Direct): POSITIVE — AB

## 2015-04-27 LAB — CULTURE, BLOOD (ROUTINE X 2)
Culture: NO GROWTH
Culture: NO GROWTH

## 2015-12-17 ENCOUNTER — Encounter: Payer: Self-pay | Admitting: *Deleted

## 2015-12-17 ENCOUNTER — Emergency Department
Admission: EM | Admit: 2015-12-17 | Discharge: 2015-12-17 | Disposition: A | Discharge status: Home / Self Care | Payer: BLUE CROSS/BLUE SHIELD | Attending: Emergency Medicine | Admitting: Emergency Medicine

## 2015-12-17 DIAGNOSIS — M62838 Other muscle spasm: Secondary | ICD-10-CM | POA: Diagnosis not present

## 2015-12-17 DIAGNOSIS — F17200 Nicotine dependence, unspecified, uncomplicated: Secondary | ICD-10-CM | POA: Diagnosis not present

## 2015-12-17 DIAGNOSIS — Z79899 Other long term (current) drug therapy: Secondary | ICD-10-CM | POA: Diagnosis not present

## 2015-12-17 DIAGNOSIS — M542 Cervicalgia: Secondary | ICD-10-CM | POA: Diagnosis present

## 2015-12-17 MED ORDER — CYCLOBENZAPRINE HCL 10 MG PO TABS: 10.0000 mg | ORAL_TABLET | Freq: Three times a day (TID) | ORAL | Status: DC | PRN

## 2015-12-17 MED ORDER — NAPROXEN 500 MG PO TABS: 500.0000 mg | ORAL_TABLET | Freq: Two times a day (BID) | ORAL | Status: DC

## 2015-12-17 MED ORDER — KETOROLAC TROMETHAMINE 60 MG/2ML IM SOLN
INTRAMUSCULAR | Status: AC
  Filled 2015-12-17: qty 2

## 2015-12-17 MED ORDER — KETOROLAC TROMETHAMINE 60 MG/2ML IM SOLN
30.0000 mg | Freq: Once | INTRAMUSCULAR | Status: AC
  Administered 2015-12-17: 30 mg via INTRAMUSCULAR

## 2015-12-17 NOTE — Discharge Instructions (Signed)
Heat Therapy Heat therapy can help ease sore, stiff, injured, and tight muscles and joints. Heat relaxes your muscles, which may help ease your pain. Heat therapy should only be used on old, pre-existing, or long-lasting (chronic) injuries. Do not use heat therapy unless told by your doctor. HOW TO USE HEAT THERAPY There are several different kinds of heat therapy, including:  Moist heat pack.  Warm water bath.  Hot water bottle.  Electric heating pad.  Heated gel pack.  Heated wrap.  Electric heating pad. GENERAL HEAT THERAPY RECOMMENDATIONS   Do not sleep while using heat therapy. Only use heat therapy while you are awake.  Your skin may turn pink while using heat therapy. Do not use heat therapy if your skin turns red.  Do not use heat therapy if you have new pain.  High heat or long exposure to heat can cause burns. Be careful when using heat therapy to avoid burning your skin.  Do not use heat therapy on areas of your skin that are already irritated, such as with a rash or sunburn. GET HELP IF:   You have blisters, redness, swelling (puffiness), or numbness.  You have new pain.  Your pain is worse. MAKE SURE YOU:  Understand these instructions.  Will watch your condition.  Will get help right away if you are not doing well or get worse.   This information is not intended to replace advice given to you by your health care provider. Make sure you discuss any questions you have with your health care provider.   Document Released: 12/15/2011 Document Revised: 10/13/2014 Document Reviewed: 11/15/2013 Elsevier Interactive Patient Education 2016 [employer] Inc[employer]   Muscle Cramps and Spasms    Muscle cramps and spasms occur when a muscle or muscles tighten and you have no control over this tightening (involuntary muscle contraction). They are a common problem and can develop in any muscle. The most common place is in the calf muscles of the leg. Both muscle cramps and  muscle spasms are involuntary muscle contractions, but they also have differences:  Muscle cramps are sporadic and painful. They may last a few seconds to a quarter of an hour. Muscle cramps are often more forceful and last longer than muscle spasms.  Muscle spasms may or may not be painful. They may also last just a few seconds or much longer. CAUSES  It is uncommon for cramps or spasms to be due to a serious underlying problem. In many cases, the cause of cramps or spasms is unknown. Some common causes are:  Overexertion.  Overuse from repetitive motions (doing the same thing over and over).  Remaining in a certain position for a long period of time.  Improper preparation, form, or technique while performing a sport or activity.  Dehydration.  Injury.  Side effects of some medicines.  Abnormally low levels of the salts and ions in your blood (electrolytes), especially potassium and calcium. This could happen if you are taking water pills (diuretics) or you are pregnant.  Some underlying medical problems can make it more likely to develop cramps or spasms. These include, but are not limited to:  Diabetes.  Parkinson disease.  Hormone disorders, such as thyroid problems.  Alcohol abuse.  Diseases specific to muscles, joints, and bones.  Blood vessel disease where not enough blood is getting to the muscles.  HOME CARE INSTRUCTIONS  Stay well hydrated. Drink enough water and fluids to keep your urine clear or pale yellow.  It may be helpful  to massage, stretch, and relax the affected muscle.  For tight or tense muscles, use a warm towel, heating pad, or hot shower water directed to the affected area.  If you are sore or have pain after a cramp or spasm, applying ice to the affected area may relieve discomfort.  Put ice in a plastic bag.  Place a towel between your skin and the bag.  Leave the ice on for 15-20 minutes, 03-04 times a day. Medicines used to treat a known cause of cramps or  spasms may help reduce their frequency or severity. Only take over-the-counter or prescription medicines as directed by your caregiver. SEEK MEDICAL CARE IF:  Your cramps or spasms get more severe, more frequent, or do not improve over time.  MAKE SURE YOU:  Understand these instructions.  Will watch your condition.  Will get help right away if you are not doing well or get worse. This information is not intended to replace advice given to you by your health care provider. Make sure you discuss any questions you have with your health care provider.  Document Released: 03/14/2002 Document Revised: 01/17/2013 Document Reviewed: 09/08/2012  Elsevier Interactive Patient Education 2016 Elsevier Inc.    Musculoskeletal Pain  Musculoskeletal pain is muscle and boney aches and pains. These pains can occur in any part of the body. Your caregiver may treat you without knowing the cause of the pain. They may treat you if blood or urine tests, X-rays, and other tests were normal.  CAUSES  There is often not a definite cause or reason for these pains. These pains may be caused by a type of germ (virus). The discomfort may also come from overuse. Overuse includes working out too hard when your body is not fit. Boney aches also come from weather changes. Bone is sensitive to atmospheric pressure changes.  HOME CARE INSTRUCTIONS  Ask when your test results will be ready. Make sure you get your test results.  Only take over-the-counter or prescription medicines for pain, discomfort, or fever as directed by your caregiver. If you were given medications for your condition, do not drive, operate machinery or power tools, or sign legal documents for 24 hours. Do not drink alcohol. Do not take sleeping pills or other medications that may interfere with treatment.  Continue all activities unless the activities cause more pain. When the pain lessens, slowly resume normal activities. Gradually increase the intensity and  duration of the activities or exercise.  During periods of severe pain, bed rest may be helpful. Lay or sit in any position that is comfortable.  Putting ice on the injured area.  Put ice in a bag.  Place a towel between your skin and the bag.  Leave the ice on for 15 to 20 minutes, 3 to 4 times a day. Follow up with your caregiver for continued problems and no reason can be found for the pain. If the pain becomes worse or does not go away, it may be necessary to repeat tests or do additional testing. Your caregiver may need to look further for a possible cause. SEEK IMMEDIATE MEDICAL CARE IF:  You have pain that is getting worse and is not relieved by medications.  You develop chest pain that is associated with shortness or breath, sweating, feeling sick to your stomach (nauseous), or throw up (vomit).  Your pain becomes localized to the abdomen.  You develop any new symptoms that seem different or that concern you. MAKE SURE YOU:  Understand these  instructions.  Will watch your condition.  Will get help right away if you are not doing well or get worse. This information is not intended to replace advice given to you by your health care provider. Make sure you discuss any questions you have with your health care provider.  Document Released: 09/22/2005 Document Revised: 12/15/2011 Document Reviewed: 05/27/2013  Elsevier Interactive Patient Education [employer]
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
## 2016-08-17 ENCOUNTER — Emergency Department
Admission: EM | Admit: 2016-08-17 | Discharge: 2016-08-17 | Disposition: A | Discharge status: Home / Self Care | Payer: BLUE CROSS/BLUE SHIELD | Attending: Student in an Organized Health Care Education/Training Program | Admitting: Student in an Organized Health Care Education/Training Program

## 2016-08-17 ENCOUNTER — Encounter: Payer: Self-pay | Admitting: Emergency Medicine

## 2016-08-17 DIAGNOSIS — J020 Streptococcal pharyngitis: Secondary | ICD-10-CM | POA: Diagnosis not present

## 2016-08-17 DIAGNOSIS — J029 Acute pharyngitis, unspecified: Secondary | ICD-10-CM | POA: Diagnosis present

## 2016-08-17 DIAGNOSIS — F17200 Nicotine dependence, unspecified, uncomplicated: Secondary | ICD-10-CM | POA: Diagnosis not present

## 2016-08-17 LAB — MONONUCLEOSIS SCREEN: Mono Screen: NEGATIVE

## 2016-08-17 MED ORDER — SODIUM CHLORIDE 0.9 % IV BOLUS (SEPSIS)
1000.0000 mL | Freq: Once | INTRAVENOUS | Status: AC
  Administered 2016-08-17: 1000 mL via INTRAVENOUS

## 2016-08-17 MED ORDER — IBUPROFEN 100 MG/5ML PO SUSP
ORAL | Status: AC
  Administered 2016-08-17: 600 mg via ORAL
  Filled 2016-08-17: qty 30

## 2016-08-17 MED ORDER — PENICILLIN G BENZATHINE 1200000 UNIT/2ML IM SUSP
1.2000 10*6.[IU] | Freq: Once | INTRAMUSCULAR | Status: AC
  Administered 2016-08-17: 1.2 10*6.[IU] via INTRAMUSCULAR
  Filled 2016-08-17 (×2): qty 2

## 2016-08-17 MED ORDER — DEXAMETHASONE SODIUM PHOSPHATE 10 MG/ML IJ SOLN
10.0000 mg | Freq: Once | INTRAMUSCULAR | Status: AC
  Administered 2016-08-17: 10 mg via INTRAVENOUS
  Filled 2016-08-17: qty 1

## 2016-08-17 MED ORDER — IBUPROFEN 100 MG/5ML PO SUSP
600.0000 mg | Freq: Once | ORAL | Status: AC
  Administered 2016-08-17: 600 mg via ORAL
  Filled 2016-08-17: qty 30

## 2016-08-17 MED ORDER — DEXAMETHASONE 10 MG/ML FOR PEDIATRIC ORAL USE: 10.0000 mg | Freq: Once | INTRAMUSCULAR | 0 refills | Status: AC

## 2016-08-17 NOTE — ED Triage Notes (Signed)
Sore throat since yesterday   Worse this am  Increase pain with swallowing

## 2016-08-17 NOTE — ED Provider Notes (Signed)
[facility] Emergency Department Provider Note    First MD Initiated Contact with Patient 08/17/16 [phone]     (approximate)  I have reviewed the triage vital signs and the nursing notes.   HISTORY  Chief Complaint Sore Throat    HPI Shearon StallsJohn Thomas Peregrina is a 30 y.o. male who presents with worsening sore throat and pain with swallowing since Thursday. States his symptoms were mild until yesterday. Has not had any recent dental procedures. Denies any measured fevers but has had intermittent chills. He has been told previously that he has large tonsils and was referred previously to ENT for tonsillectomy. Does endorse pain with swallowing. No pain under his tongue or jaw. No dental pain. No pain with moving his neck.   History reviewed. No pertinent past medical history. No family history on file. History reviewed. No pertinent surgical history. There are no active problems to display for this patient.     Prior to Admission medications   Medication Sig Start Date End Date Taking? Authorizing Provider  cyclobenzaprine (FLEXERIL) 10 MG tablet Take 1 tablet (10 mg total) by mouth 3 (three) times daily as needed for muscle spasms. 12/17/15   Jami L Hagler, PA-C  naproxen (NAPROSYN) 500 MG tablet Take 1 tablet (500 mg total) by mouth 2 (two) times daily with a meal. 12/17/15   Jami L Hagler, PA-C  omeprazole (PRILOSEC) 20 MG capsule Take 1 capsule (20 mg total) by mouth daily. 12/26/14   Harle BattiestElizabeth Tysinger, NP    Allergies Patient has no known allergies.    Social History Social History  Substance Use Topics  . Smoking status: Current Every Day Smoker  . Smokeless tobacco: Never Used  . Alcohol use Yes    Review of Systems Patient denies headaches, rhinorrhea, blurry vision, numbness, shortness of breath, chest pain, edema, cough, abdominal pain, nausea, vomiting, diarrhea, dysuria, fevers, rashes or hallucinations unless otherwise stated above in  HPI. ____________________________________________   PHYSICAL EXAM:  VITAL SIGNS: Vitals:   08/17/16 0729  BP: 123/81  Pulse: 88  Resp: 20  Temp: 97.8 F (36.6 C)    Constitutional: Alert and oriented.  in no acute distress. Eyes: Conjunctivae are normal. PERRL. EOMI. Head: Atraumatic. Nose: No congestion/rhinnorhea. Mouth/Throat: Mucous membranes are moist.  Oropharynx with kissing tonsils with midline uvula, diffuse exudates bilaterally, no trismus, no dental trauma, no sublingual firmness or induration or erythema Neck: No stridor. Painless ROM. Anterior cervical chain lymphadenopathy, Hematological/Lymphatic/Immunilogical: No cervical lymphadenopathy. Cardiovascular: Normal rate, regular rhythm. Grossly normal heart sounds.  Good peripheral circulation. Respiratory: Normal respiratory effort.  No retractions. Lungs CTAB. Gastrointestinal: Soft and nontender. No distention. No abdominal bruits. No CVA tenderness. Genitourinary:  Musculoskeletal: No lower extremity tenderness nor edema.  No joint effusions. Neurologic:  Normal speech and language. No gross focal neurologic deficits are appreciated. No gait instability. Skin:  Skin is warm, dry and intact. No rash noted. Psychiatric: Mood and affect are normal. Speech and behavior are normal.  ____________________________________________   LABS (all labs ordered are listed, but only abnormal results are displayed)  No results found for this or any previous visit (from the past 24 hour(s)). ____________________________________________  EKG________________________________  RADIOLOGY   ____________________________________________   PROCEDURES  Procedure(s) performed: none Procedures    Critical Care performed: no ____________________________________________   INITIAL IMPRESSION / ASSESSMENT AND PLAN / ED COURSE  Pertinent labs & imaging results that were available during my care of the patient were reviewed by  me and considered  in my medical decision making (see chart for details).  DDX: strep, rpa, PTA, laryngitis, tracheitis, ludwigs  Shearon StallsJohn Thomas Dupas is a 30 y.o. who presents to the ED with history of enlarged tonsils presenting with several days of sore throat. Patient is AFVSS in ED. Exam as above. Given current presentation have considered the above differential. Not consistent with PTA or RPA as no muffled voice, uvula midline, no asymmetry. + exudates.  No indication for rapid strep based on centor criteria. No neck stiffness.  No clinical findings to suggest ludwigs  Presentation most consistent with strep pharyngitis at this time. Will provide symptomatic treatment and strict return precautions.   Clinical Course as of Aug 17 1006  Wynelle LinkSun Aug 17, 2016  [id number] PAtient feeling much better as compared to previous.  No stridor.  Tolerating oral hydration.  Clinically stable for continued outpatient follow up.  Have discussed with the patient and available family all diagnostics and treatments performed thus far and all questions were answered to the best of my ability. The patient demonstrates understanding and agreement with plan.  [PR]    Clinical Course User Index [PR] Willy EddyPatrick Kismet Facemire, MD     ____________________________________________   FINAL CLINICAL IMPRESSION(S) / ED DIAGNOSES  Final diagnoses:  Pharyngitis due to Streptococcus species      NEW MEDICATIONS STARTED DURING THIS VISIT:  New Prescriptions   No medications on file     Note:  This document was prepared using Dragon voice recognition software and may include unintentional dictation errors.    Willy EddyPatrick Aynsley Fleet, MD 08/17/16 1009

## 2017-09-25 ENCOUNTER — Emergency Department: Payer: BLUE CROSS/BLUE SHIELD

## 2017-09-25 ENCOUNTER — Other Ambulatory Visit: Payer: Self-pay

## 2017-09-25 ENCOUNTER — Encounter: Payer: Self-pay | Admitting: Emergency Medicine

## 2017-09-25 DIAGNOSIS — F17210 Nicotine dependence, cigarettes, uncomplicated: Secondary | ICD-10-CM | POA: Diagnosis not present

## 2017-09-25 DIAGNOSIS — J40 Bronchitis, not specified as acute or chronic: Secondary | ICD-10-CM | POA: Insufficient documentation

## 2017-09-25 DIAGNOSIS — R0789 Other chest pain: Secondary | ICD-10-CM | POA: Diagnosis not present

## 2017-09-25 DIAGNOSIS — R05 Cough: Secondary | ICD-10-CM | POA: Diagnosis present

## 2017-09-25 NOTE — ED Triage Notes (Signed)
Pt reports cough x 1-2 weeks. Pt states that his chest hurts when he coughs. Pt reports that he is coughing up mucous in the AM. Pt is in NAD at this time.

## 2017-09-26 ENCOUNTER — Emergency Department
Admission: EM | Admit: 2017-09-26 | Discharge: 2017-09-26 | Disposition: A | Discharge status: Home / Self Care | Payer: BLUE CROSS/BLUE SHIELD | Attending: Emergency Medicine | Admitting: Emergency Medicine

## 2017-09-26 DIAGNOSIS — R0789 Other chest pain: Secondary | ICD-10-CM

## 2017-09-26 DIAGNOSIS — R05 Cough: Secondary | ICD-10-CM

## 2017-09-26 DIAGNOSIS — R059 Cough, unspecified: Secondary | ICD-10-CM

## 2017-09-26 DIAGNOSIS — J40 Bronchitis, not specified as acute or chronic: Secondary | ICD-10-CM

## 2017-09-26 LAB — BASIC METABOLIC PANEL
Anion gap: 6 (ref 5–15)
BUN: 17 mg/dL (ref 6–20)
CO2: 28 mmol/L (ref 22–32)
CREATININE: 0.82 mg/dL (ref 0.61–1.24)
Calcium: 9.5 mg/dL (ref 8.9–10.3)
Chloride: 104 mmol/L (ref 101–111)
GFR calc Af Amer: >60 mL/min (ref >60)
Glucose, Bld: 114 mg/dL — ABNORMAL HIGH (ref 65–99)
Potassium: 4 mmol/L (ref 3.5–5.1)
SODIUM: 138 mmol/L (ref 135–145)

## 2017-09-26 LAB — CBC
HCT: 51.4 % (ref 40.0–52.0)
Hemoglobin: 17.3 g/dL (ref 13.0–18.0)
MCH: 28.7 pg (ref 26.0–34.0)
MCHC: 33.6 g/dL (ref 32.0–36.0)
MCV: 85.4 fL (ref 80.0–100.0)
PLATELETS: 204 10*3/uL (ref 150–440)
RBC: 6.02 MIL/uL — ABNORMAL HIGH (ref 4.40–5.90)
RDW: 13.5 % (ref 11.5–14.5)
WBC: 16.5 10*3/uL — AB (ref 3.8–10.6)

## 2017-09-26 LAB — TROPONIN I: Troponin I: <0.03 ng/mL (ref <0.03)

## 2017-09-26 MED ORDER — METHYLPREDNISOLONE SODIUM SUCC 125 MG IJ SOLR
125.0000 mg | Freq: Once | INTRAMUSCULAR | Status: AC
  Administered 2017-09-26: 125 mg via INTRAVENOUS
  Filled 2017-09-26: qty 2

## 2017-09-26 MED ORDER — IPRATROPIUM-ALBUTEROL 0.5-2.5 (3) MG/3ML IN SOLN
3.0000 mL | Freq: Once | RESPIRATORY_TRACT | Status: AC
  Administered 2017-09-26: 3 mL via RESPIRATORY_TRACT
  Filled 2017-09-26: qty 3

## 2017-09-26 MED ORDER — ALBUTEROL SULFATE HFA 108 (90 BASE) MCG/ACT IN AERS: 2.0000 | INHALATION_SPRAY | RESPIRATORY_TRACT | 0 refills | Status: DC | PRN

## 2017-09-26 MED ORDER — PREDNISONE 20 MG PO TABS: ORAL_TABLET | ORAL | 0 refills | Status: DC

## 2017-09-26 MED ORDER — HYDROCOD POLST-CPM POLST ER 10-8 MG/5ML PO SUER
5.0000 mL | Freq: Two times a day (BID) | ORAL | 0 refills | Status: DC
Start: 2017-09-26 — End: 2019-06-04

## 2017-09-26 MED ORDER — HYDROCOD POLST-CPM POLST ER 10-8 MG/5ML PO SUER
5.0000 mL | Freq: Once | ORAL | Status: AC
  Administered 2017-09-26: 5 mL via ORAL
  Filled 2017-09-26: qty 5

## 2017-09-26 NOTE — ED Notes (Signed)
Blood draw attempted in triage x 3.

## 2017-09-26 NOTE — ED Provider Notes (Signed)
[facility]   ____________________________________________   First MD Initiated Contact with Patient 09/26/17 0037     (approximate)  I have reviewed the triage vital signs and the nursing notes.   HISTORY  Chief Complaint Chest Pain    HPI Brandon Cruz is a 31 y.o. male who presents to the ED from home with a chief complaint of cough and chest pain.  Patient reports cough productive of yellow sputum for the past 2 weeks.  Chest hurts only when he coughs.  Denies associated fever, chills, shortness of breath, abdominal pain, nausea or vomiting.  Denies recent travel or trauma.   Past medical history None  There are no active problems to display for this patient.   History reviewed. No pertinent surgical history.  Prior to Admission medications   Medication Sig Start Date End Date Taking? Authorizing Provider  albuterol (PROVENTIL HFA;VENTOLIN HFA) 108 (90 Base) MCG/ACT inhaler Inhale 2 puffs into the lungs every 4 (four) hours as needed for wheezing or shortness of breath. 09/26/17   Irean HongSung, Jade J, MD  chlorpheniramine-HYDROcodone [facility](TUSSIONEX PENNKINETIC ER) 10-8 MG/5ML SUER Take 5 mLs by mouth 2 (two) times daily. 09/26/17   Irean HongSung, Jade J, MD  predniSONE (DELTASONE) 20 MG tablet 3 tablets PO qd x 4 days 09/26/17   Irean HongSung, Jade J, MD    Allergies Patient has no known allergies.  No family history on file.  Social History Social History   Tobacco Use  . Smoking status: Current Every Day Smoker    Packs/day: 2.00    Types: Cigarettes  . Smokeless tobacco: Never Used  Substance Use Topics  . Alcohol use: Yes  . Drug use: Yes    Types: Marijuana    Review of Systems   Constitutional: No fever/chills. Eyes: No visual changes. ENT: No sore throat. Cardiovascular: Positive for chest pain. Respiratory: Positive for cough.  Denies shortness of breath. Gastrointestinal: No abdominal pain.  No nausea, no  vomiting.  No diarrhea.  No constipation. Genitourinary: Negative for dysuria. Musculoskeletal: Negative for back pain. Skin: Negative for rash. Neurological: Negative for headaches, focal weakness or numbness.   ____________________________________________   PHYSICAL EXAM:  VITAL SIGNS: ED Triage Vitals [09/25/17 2348]  Enc Vitals Group     BP      Pulse      Resp      Temp      Temp src      SpO2      Weight 245 lb (111.1 kg)     Height 5\' 9"  (1.753 m)     Head Circumference      Peak Flow      Pain Score 5     Pain Loc      Pain Edu?      Excl. in GC?     Constitutional: Alert and oriented. Well appearing and in no acute distress. Eyes: Conjunctivae are normal. PERRL. EOMI. Head: Atraumatic. Nose: No congestion/rhinnorhea. Mouth/Throat: Mucous membranes are moist.  Oropharynx non-erythematous. Neck: No stridor.   Cardiovascular: Normal rate, regular rhythm. Grossly normal heart sounds.  Good peripheral circulation. Respiratory: Normal respiratory effort.  No retractions. Lungs with scattered wheezing and rhonchi.  Active dry cough. Gastrointestinal: Soft and nontender. No distention. No abdominal bruits. No CVA tenderness. Musculoskeletal: No lower extremity tenderness nor edema.  No joint effusions. Neurologic:  Normal speech and language. No gross focal neurologic deficits are appreciated. No gait instability. Skin:  Skin is warm,  dry and intact. No rash noted. Psychiatric: Mood and affect are normal. Speech and behavior are normal.  ____________________________________________   LABS (all labs ordered are listed, but only abnormal results are displayed)  Labs Reviewed  BASIC METABOLIC PANEL - Abnormal; Notable for the following components:      Result Value   Glucose, Bld 114 (*)    All other components within normal limits  CBC - Abnormal; Notable for the following components:   WBC 16.5 (*)    RBC 6.02 (*)    All other components within normal limits    TROPONIN I   ____________________________________________  EKG  ED ECG REPORT I, SUNG,JADE J, the attending physician, personally viewed and interpreted this ECG.   Date: 09/26/2017  EKG Time: 2352  Rate: 87  Rhythm: normal EKG, normal sinus rhythm  Axis: Normal  Intervals:none  ST&T Change: Nonspecific  ____________________________________________  RADIOLOGY  Dg Chest 2 View  Result Date: 09/26/2017 CLINICAL DATA:  Cough for 1-2 weeks EXAM: CHEST  2 VIEW COMPARISON:  None. FINDINGS: The heart size and mediastinal contours are within normal limits. Both lungs are clear. The visualized skeletal structures are unremarkable. IMPRESSION: No active cardiopulmonary disease. Electronically Signed   By: Jasmine PangKim  Fujinaga M.D.   On: 09/26/2017 00:12    ____________________________________________   PROCEDURES  Procedure(s) performed: None  Procedures  Critical Care performed: No  ____________________________________________   INITIAL IMPRESSION / ASSESSMENT AND PLAN / ED COURSE  As part of my medical decision making, I reviewed the following data within the electronic MEDICAL RECORD NUMBER History obtained from family, Nursing notes reviewed and incorporated, Labs reviewed, EKG interpreted, Old chart reviewed, Radiograph reviewed and Notes from prior ED visits.   31 year old 2 pack/day smoker who presents with cough associated with chest pain. Differential includes, but is not limited to, viral syndrome, bronchitis including COPD exacerbation, pneumonia, reactive airway disease including asthma, CHF including exacerbation with or without pulmonary/interstitial edema, pneumothorax, ACS, thoracic trauma, and pulmonary embolism.  Chest x-ray is negative for pneumonia.  Lab work pending.  Will administer DuoNeb, steroid, Tussionex and reassess.  Clinical Course as of Sep 27 151  Sat Sep 26, 2017  0150 Lung sounds cleared.  Room air saturation 99%.  Patient overall feeling better.   Will discharge home on prednisone burst, Tussionex, albuterol inhaler.  Strict return precautions given.  Patient and family member verbalize understanding and agree with plan of care.  [JS]    Clinical Course User Index [JS] Irean HongSung, Jade J, MD     ____________________________________________   FINAL CLINICAL IMPRESSION(S) / ED DIAGNOSES  Final diagnoses:  Cough  Bronchitis  Chest wall pain     ED Discharge Orders        Ordered    predniSONE (DELTASONE) 20 MG tablet     09/26/17 0151    albuterol (PROVENTIL HFA;VENTOLIN HFA) 108 (90 Base) MCG/ACT inhaler  Every 4 hours PRN     09/26/17 0151    chlorpheniramine-HYDROcodone (TUSSIONEX PENNKINETIC ER) 10-8 MG/5ML SUER  2 times daily     09/26/17 0151       Note:  This document was prepared using Dragon voice recognition software and may include unintentional dictation errors.    Irean HongSung, Jade J, MD 09/26/17 [phone]

## 2017-09-26 NOTE — Discharge Instructions (Signed)
1.  Take steroid as prescribed (prednisone 60 mg daily x 4 days).  Start your next dose Sunday morning. 2.  You may take cough medicine as needed (Tussionex). 3.  You may use albuterol inhaler 2 puffs every 4 hours as needed for wheezing or difficulty breathing. 4.  Return to the ER for worsening symptoms, persistent vomiting, difficulty breathing or other concerns.

## 2017-10-27 ENCOUNTER — Emergency Department
Admission: EM | Admit: 2017-10-27 | Discharge: 2017-10-27 | Disposition: A | Discharge status: Home / Self Care | Payer: BLUE CROSS/BLUE SHIELD | Attending: Emergency Medicine | Admitting: Emergency Medicine

## 2017-10-27 ENCOUNTER — Encounter: Payer: Self-pay | Admitting: Emergency Medicine

## 2017-10-27 ENCOUNTER — Other Ambulatory Visit: Payer: Self-pay

## 2017-10-27 DIAGNOSIS — Z79899 Other long term (current) drug therapy: Secondary | ICD-10-CM | POA: Diagnosis not present

## 2017-10-27 DIAGNOSIS — R1110 Vomiting, unspecified: Secondary | ICD-10-CM

## 2017-10-27 DIAGNOSIS — F17210 Nicotine dependence, cigarettes, uncomplicated: Secondary | ICD-10-CM | POA: Insufficient documentation

## 2017-10-27 DIAGNOSIS — K219 Gastro-esophageal reflux disease without esophagitis: Secondary | ICD-10-CM

## 2017-10-27 MED ORDER — FAMOTIDINE 20 MG PO TABS
40.0000 mg | ORAL_TABLET | Freq: Once | ORAL | Status: AC
  Administered 2017-10-27: 40 mg via ORAL
  Filled 2017-10-27: qty 2

## 2017-10-27 MED ORDER — METOCLOPRAMIDE HCL 10 MG PO TABS: 10.0000 mg | ORAL_TABLET | Freq: Four times a day (QID) | ORAL | 0 refills | Status: DC | PRN

## 2017-10-27 MED ORDER — METOCLOPRAMIDE HCL 10 MG PO TABS
10.0000 mg | ORAL_TABLET | Freq: Once | ORAL | Status: AC
  Administered 2017-10-27: 10 mg via ORAL
  Filled 2017-10-27: qty 1

## 2017-10-27 MED ORDER — FAMOTIDINE 20 MG PO TABS: 20.0000 mg | ORAL_TABLET | Freq: Two times a day (BID) | ORAL | 0 refills | Status: AC

## 2017-10-27 MED ORDER — ONDANSETRON 4 MG PO TBDP: 4.0000 mg | ORAL_TABLET | Freq: Three times a day (TID) | ORAL | 0 refills | Status: DC | PRN

## 2017-10-27 MED ORDER — GI COCKTAIL ~~LOC~~
30.0000 mL | ORAL | Status: AC
  Administered 2017-10-27: 30 mL via ORAL
  Filled 2017-10-27: qty 30

## 2017-10-27 NOTE — ED Notes (Signed)
Bill RN aware of placement in room 7.

## 2017-10-27 NOTE — ED Provider Notes (Signed)
[facility] Emergency Department Provider Note  ____________________________________________  Time seen: Approximately 8:44 AM  I have reviewed the triage vital signs and the nursing notes.   HISTORY  Chief Complaint Emesis    HPI Brandon Cruz is a 32 y.o. male who complains of vomiting yesterday morning and again this morning. None during the day. He does note a history of GERD and over the last couple of nights is been much worse when he is lying down at night. He states he tried taking Prilosec and Zantac without relief. Denies any pain fevers chills or sweats. No weight changes. No chest pain or shortness of breath. Symptoms are intermittent, worse with eating, no alleviating factors, no radiating pain, mild to moderate severity.     History reviewed. No pertinent past medical history. GERD  There are no active problems to display for this patient.    History reviewed. No pertinent surgical history.   Prior to Admission medications   Medication Sig Start Date End Date Taking? Authorizing Provider  albuterol (PROVENTIL HFA;VENTOLIN HFA) 108 (90 Base) MCG/ACT inhaler Inhale 2 puffs into the lungs every 4 (four) hours as needed for wheezing or shortness of breath. Patient not taking: Reported on 10/27/2017 09/26/17   Irean Hong, MD  chlorpheniramine-HYDROcodone [facility] ER) 10-8 MG/5ML SUER Take 5 mLs by mouth 2 (two) times daily. Patient not taking: Reported on 10/27/2017 09/26/17   Irean Hong, MD  famotidine (PEPCID) 20 MG tablet Take 1 tablet (20 mg total) by mouth 2 (two) times daily. 10/27/17   Sharman Cheek, MD  metoCLOPramide (REGLAN) 10 MG tablet Take 1 tablet (10 mg total) by mouth every 6 (six) hours as needed. 10/27/17   Sharman Cheek, MD  ondansetron (ZOFRAN ODT) 4 MG disintegrating tablet Take 1 tablet (4 mg total) by mouth every 8 (eight) hours as needed for nausea or vomiting. 10/27/17   Sharman Cheek, MD   predniSONE (DELTASONE) 20 MG tablet 3 tablets PO qd x 4 days Patient not taking: Reported on 10/27/2017 09/26/17   Irean Hong, MD     Allergies Patient has no known allergies.   No family history on file.  Social History Social History   Tobacco Use  . Smoking status: Current Every Day Smoker    Packs/day: 2.00    Types: Cigarettes  . Smokeless tobacco: Never Used  Substance Use Topics  . Alcohol use: Yes  . Drug use: Yes    Types: Marijuana    Review of Systems  Constitutional:   No fever or chills.  ENT:   Positive sore throat. No rhinorrhea. Cardiovascular:   No chest pain or syncope. Respiratory:   No dyspnea or cough. Gastrointestinal:   Negative for abdominal pain, positive vomiting. No diarrhea or constipation.   All other systems reviewed and are negative except as documented above in ROS and HPI.  ____________________________________________   PHYSICAL EXAM:  VITAL SIGNS: ED Triage Vitals  Enc Vitals Group     BP 10/27/17 0658 (!) 133/93     Pulse Rate 10/27/17 0658 71     Resp 10/27/17 0658 18     Temp 10/27/17 0658 97.7 F (36.5 C)     Temp Source 10/27/17 0658 Oral     SpO2 10/27/17 0658 96 %     Weight 10/27/17 0656 234 lb (106.1 kg)     Height 10/27/17 0656 5\' 9"  (1.753 m)     Head Circumference --  Peak Flow --      Pain Score --      Pain Loc --      Pain Edu? --      Excl. in GC? --     Vital signs reviewed, nursing assessments reviewed.   Constitutional:   Alert and oriented. Well appearing and in no distress. Eyes:   No scleral icterus.  EOMI. No nystagmus. No conjunctival pallor. PERRL. ENT   Head:   Normocephalic and atraumatic.   Nose:   No congestion/rhinnorhea.    Mouth/Throat:   MMM, mild pharyngeal erythema. Large tonsils without peritonsillar mass or exudate.    Neck:   No meningismus. Full ROM. Hematological/Lymphatic/Immunilogical:   No cervical lymphadenopathy. Cardiovascular:   RRR. Symmetric  bilateral radial and DP pulses.  No murmurs.  Respiratory:   Normal respiratory effort without tachypnea/retractions. Breath sounds are clear and equal bilaterally. No wheezes/rales/rhonchi. Gastrointestinal:   Soft and nontender. Non distended. There is no CVA tenderness.  No rebound, rigidity, or guarding. Genitourinary:   deferred Musculoskeletal:   Normal range of motion in all extremities. No joint effusions.  No lower extremity tenderness.  No edema. Neurologic:   Normal speech and language.  Motor grossly intact. No acute focal neurologic deficits are appreciated.  ____________________________________________    LABS (pertinent positives/negatives) (all labs ordered are listed, but only abnormal results are displayed) Labs Reviewed - No data to display ____________________________________________   EKG    ____________________________________________    RADIOLOGY  No results found.  ____________________________________________   PROCEDURES Procedures  ____________________________________________    CLINICAL IMPRESSION / ASSESSMENT AND PLAN / ED COURSE  Pertinent labs & imaging results that were available during my care of the patient were reviewed by me and considered in my medical decision making (see chart for details).   Patient well-appearing no acute distress, presents with intermittent episodes of vomiting, most likely GERD.Considering the patient's symptoms, medical history, and physical examination today, I have low suspicion for cholecystitis or biliary pathology, pancreatitis, perforation or bowel obstruction, hernia, intra-abdominal abscess, AAA or dissection, volvulus or intussusception, mesenteric ischemia, or appendicitis.  Vital signs are normal, patient is nontoxic. Given a GI cocktail and sitting upright tolerating oral intake. Discharge home on Reglan and Pepcid and Zofran. Counseled on smoking cessation.       ____________________________________________   FINAL CLINICAL IMPRESSION(S) / ED DIAGNOSES    Final diagnoses:  Vomiting, intractability of vomiting not specified, presence of nausea not specified, unspecified vomiting type  Gastroesophageal reflux disease, esophagitis presence not specified       Portions of this note were generated with dragon dictation software. Dictation errors may occur despite best attempts at proofreading.    Sharman CheekStafford, Alvan Culpepper, MD 10/27/17 [phone]

## 2017-10-27 NOTE — ED Notes (Signed)
Pt a/o. Denies N/V at this time. Denies pain. +bowel sounds, clear bilateral breath sounds.

## 2017-10-27 NOTE — ED Triage Notes (Signed)
Patient ambulatory to triage with steady gait, without difficulty or distress noted; pt reports vomiting since yesterday; denies abd pain

## 2019-06-04 ENCOUNTER — Emergency Department: Payer: Self-pay

## 2019-06-04 ENCOUNTER — Encounter: Payer: Self-pay | Admitting: Emergency Medicine

## 2019-06-04 ENCOUNTER — Emergency Department
Admission: EM | Admit: 2019-06-04 | Discharge: 2019-06-04 | Disposition: A | Discharge status: Home / Self Care | Payer: Self-pay | Attending: Emergency Medicine | Admitting: Emergency Medicine

## 2019-06-04 ENCOUNTER — Emergency Department
Admission: EM | Admit: 2019-06-04 | Discharge: 2019-06-04 | Discharge status: Against Medical Advice | Payer: BLUE CROSS/BLUE SHIELD | Attending: Emergency Medicine | Admitting: Emergency Medicine

## 2019-06-04 ENCOUNTER — Other Ambulatory Visit: Payer: Self-pay

## 2019-06-04 DIAGNOSIS — Z79899 Other long term (current) drug therapy: Secondary | ICD-10-CM | POA: Insufficient documentation

## 2019-06-04 DIAGNOSIS — L03213 Periorbital cellulitis: Secondary | ICD-10-CM | POA: Insufficient documentation

## 2019-06-04 DIAGNOSIS — Z5321 Procedure and treatment not carried out due to patient leaving prior to being seen by health care provider: Secondary | ICD-10-CM | POA: Insufficient documentation

## 2019-06-04 DIAGNOSIS — F17210 Nicotine dependence, cigarettes, uncomplicated: Secondary | ICD-10-CM | POA: Insufficient documentation

## 2019-06-04 LAB — CBC WITH DIFFERENTIAL/PLATELET
Abs Immature Granulocytes: 0.05 10*3/uL (ref 0.00–0.07)
Basophils Absolute: 0.1 10*3/uL (ref 0.0–0.1)
Basophils Relative: 1 %
Eosinophils Absolute: 1 10*3/uL — ABNORMAL HIGH (ref 0.0–0.5)
Eosinophils Relative: 8 %
HCT: 51 % (ref 39.0–52.0)
Hemoglobin: 17.4 g/dL — ABNORMAL HIGH (ref 13.0–17.0)
Immature Granulocytes: 0 %
Lymphocytes Relative: 24 %
Lymphs Abs: 2.9 10*3/uL (ref 0.7–4.0)
MCH: 29.6 pg (ref 26.0–34.0)
MCHC: 34.1 g/dL (ref 30.0–36.0)
MCV: 86.9 fL (ref 80.0–100.0)
Monocytes Absolute: 1 10*3/uL (ref 0.1–1.0)
Monocytes Relative: 8 %
Neutro Abs: 6.9 10*3/uL (ref 1.7–7.7)
Neutrophils Relative %: 59 %
Platelets: 183 10*3/uL (ref 150–400)
RBC: 5.87 MIL/uL — ABNORMAL HIGH (ref 4.22–5.81)
RDW: 12.8 % (ref 11.5–15.5)
WBC: 11.8 10*3/uL — ABNORMAL HIGH (ref 4.0–10.5)
nRBC: 0 % (ref 0.0–0.2)

## 2019-06-04 LAB — BASIC METABOLIC PANEL
Anion gap: 8 (ref 5–15)
BUN: 21 mg/dL — ABNORMAL HIGH (ref 6–20)
CO2: 27 mmol/L (ref 22–32)
Calcium: 9.5 mg/dL (ref 8.9–10.3)
Chloride: 108 mmol/L (ref 98–111)
Creatinine, Ser: 0.89 mg/dL (ref 0.61–1.24)
GFR calc Af Amer: >60 mL/min (ref >60)
GFR calc non Af Amer: >60 mL/min (ref >60)
Glucose, Bld: 130 mg/dL — ABNORMAL HIGH (ref 70–99)
Potassium: 3.7 mmol/L (ref 3.5–5.1)
Sodium: 143 mmol/L (ref 135–145)

## 2019-06-04 LAB — SEDIMENTATION RATE: Sed Rate: 1 mm/hr (ref 0–15)

## 2019-06-04 LAB — C-REACTIVE PROTEIN: CRP: 1.6 mg/dL — ABNORMAL HIGH (ref <1.0)

## 2019-06-04 MED ORDER — AMOXICILLIN-POT CLAVULANATE 875-125 MG PO TABS
1.0000 | ORAL_TABLET | Freq: Once | ORAL | Status: AC
  Administered 2019-06-04: 21:00:00 1 via ORAL
  Filled 2019-06-04: qty 1

## 2019-06-04 MED ORDER — AMOXICILLIN-POT CLAVULANATE 875-125 MG PO TABS: 1.0000 | ORAL_TABLET | Freq: Two times a day (BID) | ORAL | 0 refills | Status: AC

## 2019-06-04 MED ORDER — IOHEXOL 300 MG/ML  SOLN
75.0000 mL | Freq: Once | INTRAMUSCULAR | Status: AC | PRN
  Administered 2019-06-04: 20:00:00 75 mL via INTRAVENOUS

## 2019-06-04 NOTE — Discharge Instructions (Signed)
Your CT scan was consistent with preseptal cellulitis.  We prescribed antibiotic.  Return to the ER if you develop worsening fevers, swelling or any other concerns  IMPRESSION: 1. Superficial right face, forehead and preseptal cellulitis. No postseptal involvement of the orbit, no drainable fluid collection or other complicating features. 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 3. Subtle sequelae of the 2016 right tonsillar abscess. 4. Dental caries.

## 2019-06-04 NOTE — ED Triage Notes (Signed)
States has had intermittent R facial swelling x 3 weeks. Denies being painful. States 3 weeks ago he was stung by something above R brow and he attributed the swelling to that. Denies use of blood pressure medicines. Has not had swelling in face before. R upper face noticeably swollen. No resp distress or trouble swallowing.

## 2019-06-04 NOTE — ED Provider Notes (Signed)
[facility] Emergency Department Provider Note  ____________________________________________   First MD Initiated Contact with Patient 06/04/19 1829     (approximate)  I have reviewed Brandon triage vital signs and Brandon nursing notes.   HISTORY  Chief Complaint Angioedema    HPI Brandon Cruz is a 33 y.o. male who presents with swelling.  Patient had a insect bite 3 weeks ago.  He had some swelling and took  Benadryl.  Originally Brandon swelling went down Brandon then it reappeared.  Brandon swelling is moderate, constant, has not been taking anything says of Benadryl, nothing makes it worse.  He denies any vision changes, no pain with eye movement.  No difficulty swallowing.  No infections inside of his mouth.          History reviewed. No pertinent past medical history.  There are no active problems to display for this patient.   History reviewed. No pertinent surgical history.  Prior to Admission medications   Medication Sig Start Date End Date Taking? Authorizing Provider  albuterol (PROVENTIL HFA;VENTOLIN HFA) 108 (90 Base) MCG/ACT inhaler Inhale 2 puffs into Brandon lungs every 4 (four) hours as needed for wheezing or shortness of breath. Patient not taking: Reported on 10/27/2017 09/26/17   Paulette Blanch, MD  chlorpheniramine-HYDROcodone [facility]) 10-8 MG/5ML SUER Take 5 mLs by mouth 2 (two) times daily. Patient not taking: Reported on 10/27/2017 09/26/17   Paulette Blanch, MD  famotidine (PEPCID) 20 MG tablet Take 1 tablet (20 mg total) by mouth 2 (two) times daily. 10/27/17   Carrie Mew, MD  metoCLOPramide (REGLAN) 10 MG tablet Take 1 tablet (10 mg total) by mouth every 6 (six) hours as needed. 10/27/17   Carrie Mew, MD  ondansetron (ZOFRAN ODT) 4 MG disintegrating tablet Take 1 tablet (4 mg total) by mouth every 8 (eight) hours as needed for nausea or vomiting. 10/27/17   Carrie Mew, MD  predniSONE (DELTASONE) 20 MG tablet 3  tablets PO qd x 4 days Patient not taking: Reported on 10/27/2017 09/26/17   Paulette Blanch, MD    Allergies Patient has no known allergies.  No family history on file.  Social History Social History   Tobacco Use  . Smoking status: Current Every Day Smoker    Packs/day: 2.00    Types: Cigarettes  . Smokeless tobacco: Never Used  Substance Use Topics  . Alcohol use: Yes  . Drug use: Yes    Types: Marijuana      Review of Systems Constitutional: No fever/chills Eyes: No visual changes. ENT: No sore throat.  Positive for swelling Cardiovascular: Denies chest pain. Respiratory: Denies shortness of breath. Gastrointestinal: No abdominal pain.  No nausea, no vomiting.  No diarrhea.  No constipation. Genitourinary: Negative for dysuria. Musculoskeletal: Negative for back pain. Skin: Negative for rash. Neurological: Negative for headaches, focal weakness or numbness. All other ROS negative ____________________________________________   PHYSICAL EXAM:  VITAL SIGNS: ED Triage Vitals  Enc Vitals Group     BP 06/04/19 1639 120/70     Pulse Rate 06/04/19 1639 90     Resp 06/04/19 1639 20     Temp 06/04/19 1639 99 F (37.2 C)     Temp Source 06/04/19 1639 Oral     SpO2 06/04/19 1639 94 %     Weight 06/04/19 1641 250 lb (113.4 kg)     Height 06/04/19 1641 5\' 9"  (1.753 m)     Head Circumference --  Peak Flow --      Pain Score 06/04/19 1641 0     Pain Loc --      Pain Edu? --      Excl. in GC? --     Constitutional: Alert and oriented. Well appearing and in no acute distress. Eyes: Conjunctivae are normal. EOMI. Head: Atraumatic.  TMs are clear.  Swelling around Brandon right eye with some mild erythema on Brandon forehead, extraocular movements are intact, normal vision, no chemosis. Nose: No congestion/rhinnorhea. Mouth/Throat: Mucous membranes are moist.  OP is clear, no evidence of infection on Brandon inside of his mouth Neck: No stridor. Trachea Midline. FROM  Cardiovascular: Normal rate, regular rhythm. Grossly normal heart sounds.  Good peripheral circulation. Respiratory: Normal respiratory effort.  No retractions. Lungs CTAB. Gastrointestinal: Soft and nontender. No distention. No abdominal bruits.  Musculoskeletal: No lower extremity tenderness nor edema.  No joint effusions. Neurologic:  Normal speech and language. No gross focal neurologic deficits are appreciated.  Skin:  Skin is warm, dry and intact. No rash noted. Psychiatric: Mood and affect are normal. Speech and behavior are normal. GU: Deferred   ____________________________________________   LABS (all labs ordered are listed, Brandon only abnormal results are displayed)  Labs Reviewed  CBC WITH DIFFERENTIAL/PLATELET - Abnormal; Notable for Brandon following components:      Result Value   WBC 11.8 (*)    RBC 5.87 (*)    Hemoglobin 17.4 (*)    Eosinophils Absolute 1.0 (*)    All other components within normal limits  BASIC METABOLIC PANEL - Abnormal; Notable for Brandon following components:   Glucose, Bld 130 (*)    BUN 21 (*)    All other components within normal limits  SEDIMENTATION RATE  C-REACTIVE PROTEIN   ____________________________________________   RADIOLOGY   Official radiology report(s): Ct Maxillofacial W Contrast  Result Date: 06/04/2019 CLINICAL DATA:  33 year old male with intermittent right facial swelling for 3 weeks. Periorbital and cheek swelling which may begun as an insect sting. EXAM: CT MAXILLOFACIAL WITH CONTRAST TECHNIQUE: Multidetector CT imaging of Brandon maxillofacial structures was performed with intravenous contrast. Multiplanar CT image reconstructions were also generated. CONTRAST:  75mL OMNIPAQUE IOHEXOL 300 MG/ML  SOLN COMPARISON:  Neck CT 04/21/2015. FINDINGS: Osseous: Intact mandible. Scattered carious dentition including Brandon right posterior maxillary molar, right maxillary canine, left mandible posterior molar. Maxilla, zygoma, nasal bones,  central skull base and visible cervical vertebrae appear intact. Intact visible calvarium. Orbits: Intact orbital walls. There is superficial right periorbital soft tissue swelling, thickening and stranding up to 11 millimeters (77 series 2, image 22). Brandon underlying 7 right globe7 appears normal. Brandon intraorbital right soft tissues remain normal. Brandon left orbit appears normal. There is associated soft tissue swelling of Brandon right forehead and premalar space. No soft tissue gas. No drainable fluid collection. Sinuses: Paranasal sinuses and mastoids are stable and well pneumatized. Soft tissues: Superficial right face soft tissues are described above. Negative visible thyroid, larynx, parapharyngeal spaces, retropharyngeal space, sublingual space, submandibular spaces, masticator and left parotid spaces. There is chronic partial atrophy of Brandon right parotid gland which is stable since 2016. Associated punctate right parotid sialolithiasis. There is mild asymmetric heterogeneity of Brandon right palatine tonsil on series 2, image 63, corresponding to Brandon site of a tonsillar abscess in 2016. Brandon visible cervical lymph nodes are within normal limits. Brandon visible major vascular structures in Brandon neck and at Brandon skull base are patent. Limited intracranial: Negative visible brain parenchyma. Brandon cavernous  sinus appears symmetric and patent. IMPRESSION: 1. Superficial right face, forehead and preseptal cellulitis. No postseptal involvement of Brandon orbit, no drainable fluid collection or other complicating features. 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 3. Subtle sequelae of Brandon 2016 right tonsillar abscess. 4. Dental caries. Electronically Signed   By: Odessa Fleming M.D.   On: 06/04/2019 20:02    ____________________________________________   PROCEDURES  Procedure(s) performed (including Critical Care):  Procedures   ____________________________________________   INITIAL IMPRESSION / ASSESSMENT AND  PLAN / ED COURSE  Christofer Carrisalez was evaluated in Emergency Department on 06/04/2019 for Brandon symptoms described in Brandon history of present illness. He was evaluated in Brandon context of Brandon global COVID-19 pandemic, which necessitated consideration that Brandon patient might be at risk for infection with Brandon SARS-CoV-2 virus that causes COVID-19. Institutional protocols and algorithms that pertain to Brandon evaluation of patients at risk for COVID-19 are in a state of rapid change based on information released by regulatory bodies including Brandon CDC and federal and state organizations. These policies and algorithms were followed during Brandon patient's care in Brandon ED.    This most concerning for preseptal cellulitis.  Will get CT scan to make sure that there is no abscess versus orbital cellulitis.  Patient does not have any criteria for sepsis Cruz low suspicious for bacteremia.  Patient is very well-appearing.  No difficulty swallowing to suggest RPA or PTA.  CT scan consistent with preseptal cellulitis.  Discussed with patient admission for IV antibiotics versus going home.  Given patient's well-appearing with no evidence of sepsis patient would prefer to go home.  We will give a dose of Augmentin now and send patient home on Augmentin.  Patient instructed to come back if his symptoms are worsening or he develops any new symptoms.  I discussed Brandon provisional nature of ED diagnosis, Brandon treatment so far, Brandon ongoing plan of care, follow up appointments and return precautions with Brandon patient and any family or support people present. They expressed understanding and agreed with Brandon plan, discharged home.   ________________________________________   FINAL CLINICAL IMPRESSION(S) / ED DIAGNOSES   Final diagnoses:  Preseptal cellulitis of right eye      MEDICATIONS GIVEN DURING THIS VISIT:  Medications  amoxicillin-clavulanate (AUGMENTIN) 875-125 MG per tablet 1 tablet (has no administration in time  range)  iohexol (OMNIPAQUE) 300 MG/ML solution 75 mL (75 mLs Intravenous Contrast Given 06/04/19 1934)     ED Discharge Orders         Ordered    amoxicillin-clavulanate (AUGMENTIN) 875-125 MG tablet  2 times daily     06/04/19 2052           Note:  This document was prepared using Dragon voice recognition software and may include unintentional dictation errors.   Concha Se, MD 06/04/19 2053

## 2019-06-04 NOTE — ED Triage Notes (Signed)
Pt arrives POV and ambulatory to triage with c/o possible insect bite x 3 weeks ago which he took benadryl for and the swelling went away. Pt states that the swollen area has been rotating around his face with no new bite noted. Pt in NAD.

## 2020-01-02 ENCOUNTER — Other Ambulatory Visit: Payer: Self-pay

## 2020-01-02 ENCOUNTER — Emergency Department
Admission: EM | Admit: 2020-01-02 | Discharge: 2020-01-02 | Disposition: A | Discharge status: Home / Self Care | Payer: Self-pay | Attending: Emergency Medicine | Admitting: Emergency Medicine

## 2020-01-02 DIAGNOSIS — Z20822 Contact with and (suspected) exposure to covid-19: Secondary | ICD-10-CM | POA: Insufficient documentation

## 2020-01-02 DIAGNOSIS — F17210 Nicotine dependence, cigarettes, uncomplicated: Secondary | ICD-10-CM | POA: Insufficient documentation

## 2020-01-02 DIAGNOSIS — Z79899 Other long term (current) drug therapy: Secondary | ICD-10-CM | POA: Insufficient documentation

## 2020-01-02 DIAGNOSIS — K529 Noninfective gastroenteritis and colitis, unspecified: Secondary | ICD-10-CM | POA: Insufficient documentation

## 2020-01-02 LAB — COMPREHENSIVE METABOLIC PANEL
ALT: 60 U/L — ABNORMAL HIGH (ref 0–44)
AST: 27 U/L (ref 15–41)
Albumin: 4.1 g/dL (ref 3.5–5.0)
Alkaline Phosphatase: 58 U/L (ref 38–126)
Anion gap: 10 (ref 5–15)
BUN: 18 mg/dL (ref 6–20)
CO2: 25 mmol/L (ref 22–32)
Calcium: 8.8 mg/dL — ABNORMAL LOW (ref 8.9–10.3)
Chloride: 103 mmol/L (ref 98–111)
Creatinine, Ser: 0.92 mg/dL (ref 0.61–1.24)
GFR calc Af Amer: >60 mL/min (ref >60)
GFR calc non Af Amer: >60 mL/min (ref >60)
Glucose, Bld: 124 mg/dL — ABNORMAL HIGH (ref 70–99)
Potassium: 3.7 mmol/L (ref 3.5–5.1)
Sodium: 138 mmol/L (ref 135–145)
Total Bilirubin: 1.5 mg/dL — ABNORMAL HIGH (ref 0.3–1.2)
Total Protein: 7.7 g/dL (ref 6.5–8.1)

## 2020-01-02 LAB — URINALYSIS, COMPLETE (UACMP) WITH MICROSCOPIC
Bacteria, UA: NONE SEEN
Bilirubin Urine: NEGATIVE
Glucose, UA: NEGATIVE mg/dL
Ketones, ur: NEGATIVE mg/dL
Leukocytes,Ua: NEGATIVE
Nitrite: NEGATIVE
Protein, ur: NEGATIVE mg/dL
Specific Gravity, Urine: 1.026 (ref 1.005–1.030)
Squamous Epithelial / HPF: NONE SEEN (ref 0–5)
pH: 5 (ref 5.0–8.0)

## 2020-01-02 LAB — CBC
HCT: 52.5 % — ABNORMAL HIGH (ref 39.0–52.0)
Hemoglobin: 17.9 g/dL — ABNORMAL HIGH (ref 13.0–17.0)
MCH: 29.6 pg (ref 26.0–34.0)
MCHC: 34.1 g/dL (ref 30.0–36.0)
MCV: 86.8 fL (ref 80.0–100.0)
Platelets: 164 10*3/uL (ref 150–400)
RBC: 6.05 MIL/uL — ABNORMAL HIGH (ref 4.22–5.81)
RDW: 12.8 % (ref 11.5–15.5)
WBC: 10.6 10*3/uL — ABNORMAL HIGH (ref 4.0–10.5)
nRBC: 0 % (ref 0.0–0.2)

## 2020-01-02 LAB — LIPASE, BLOOD: Lipase: 20 U/L (ref 11–51)

## 2020-01-02 LAB — POC SARS CORONAVIRUS 2 AG: SARS Coronavirus 2 Ag: NEGATIVE

## 2020-01-02 MED ORDER — SODIUM CHLORIDE 0.9 % IV BOLUS
1000.0000 mL | Freq: Once | INTRAVENOUS | Status: AC
  Administered 2020-01-02: 1000 mL via INTRAVENOUS

## 2020-01-02 MED ORDER — ONDANSETRON 4 MG PO TBDP: 4.0000 mg | ORAL_TABLET | Freq: Three times a day (TID) | ORAL | 0 refills | Status: DC | PRN

## 2020-01-02 MED ORDER — SODIUM CHLORIDE 0.9 % IV BOLUS
1000.0000 mL | Freq: Once | INTRAVENOUS | Status: AC
  Administered 2020-01-02: 11:00:00 1000 mL via INTRAVENOUS

## 2020-01-02 MED ORDER — SODIUM CHLORIDE 0.9% FLUSH: 3.0000 mL | Freq: Once | INTRAVENOUS | Status: DC

## 2020-01-02 NOTE — ED Triage Notes (Signed)
Pt c/o N/V/D with fever and body aches since Saturday. Pt is profusely sweating in triage. Denies any abd pain. States he has had a slight cough, denies any known contact with covid.

## 2020-01-02 NOTE — ED Provider Notes (Signed)
[facility] Emergency Department Provider Note  Time seen: 10:31 AM  I have reviewed the triage vital signs and the nursing notes.   HISTORY  Chief Complaint Fever and Diarrhea  HPI Brandon Cruz is a 34 y.o. male with no significant past medical history presents to the emergency department for nausea vomiting diarrhea.  According to the patient since Sunday morning he has been experiencing nausea vomiting and diarrhea.  States body aches but denies any focal pain.  Denies any focal abdominal pain.  Patient states he did have a low-grade temperature yesterday per patient.  Denies any dysuria or dark urine.  Patient denies any cough or shortness of breath.  No known Covid contacts.   History reviewed. No pertinent past medical history.  There are no problems to display for this patient.   History reviewed. No pertinent surgical history.  Prior to Admission medications   Medication Sig Start Date End Date Taking? Authorizing Provider  famotidine (PEPCID) 20 MG tablet Take 1 tablet (20 mg total) by mouth 2 (two) times daily. 10/27/17   Sharman Cheek, MD  metoCLOPramide (REGLAN) 10 MG tablet Take 1 tablet (10 mg total) by mouth every 6 (six) hours as needed. 10/27/17   Sharman Cheek, MD  ondansetron (ZOFRAN ODT) 4 MG disintegrating tablet Take 1 tablet (4 mg total) by mouth every 8 (eight) hours as needed for nausea or vomiting. 10/27/17   Sharman Cheek, MD    No Known Allergies  No family history on file.  Social History Social History   Tobacco Use  . Smoking status: Current Every Day Smoker    Packs/day: 2.00    Types: Cigarettes  . Smokeless tobacco: Never Used  Substance Use Topics  . Alcohol use: Yes  . Drug use: Yes    Types: Marijuana    Review of Systems Constitutional: Negative for fever. Cardiovascular: Negative for chest pain. Respiratory: Negative for shortness of breath. Gastrointestinal: Negative for abdominal pain.   Positive for nausea vomiting diarrhea x2 days. Genitourinary: Negative for urinary compaints Musculoskeletal: Negative for musculoskeletal complaints Neurological: Negative for headache All other ROS negative  ____________________________________________   PHYSICAL EXAM:  VITAL SIGNS: ED Triage Vitals  Enc Vitals Group     BP 01/02/20 0852 138/88     Pulse Rate 01/02/20 0852 (!) 108     Resp 01/02/20 0852 18     Temp 01/02/20 0854 98 F (36.7 C)     Temp Source 01/02/20 0852 Oral     SpO2 01/02/20 0852 95 %     Weight 01/02/20 0853 260 lb (117.9 kg)     Height 01/02/20 0853 5\' 9"  (1.753 m)     Head Circumference --      Peak Flow --      Pain Score 01/02/20 0853 0     Pain Loc --      Pain Edu? --      Excl. in GC? --     Constitutional: Alert and oriented. Well appearing and in no distress. Eyes: Normal exam ENT      Head: Normocephalic and atraumatic.      Mouth/Throat: Mucous membranes are moist. Cardiovascular: Normal rate, regular rhythm. No murmur Respiratory: Normal respiratory effort without tachypnea nor retractions. Breath sounds are clear  Gastrointestinal: Soft and nontender. No distention Musculoskeletal: Nontender with normal range of motion in all extremities.  Neurologic:  Normal speech and language. No gross focal neurologic deficits  Skin:  Skin is warm, dry and  intact.  Psychiatric: Mood and affect are normal.  ____________________________________________   INITIAL IMPRESSION / ASSESSMENT AND PLAN / ED COURSE  Pertinent labs & imaging results that were available during my care of the patient were reviewed by me and considered in my medical decision making (see chart for details).   Patient presents to the emergency department for nausea vomiting diarrhea over the past 2 days.  Patient's lab work does appear dehydrated/hemoconcentrated.  However overall no significant/concerning findings.  Nontender abdomen including right upper quadrant.  We will  IV hydrate, obtain a urine sample and continue to closely monitor.  Patient agreeable to plan of care.  Covid is negative.  Patient feeling better after fluids and nausea medication.  Urinalysis is negative.  We will discharge with supportive care for likely gastroenteritis.  Patient agreeable to plan of care.  [name] was evaluated in Emergency Department on 01/02/2020 for the symptoms described in the history of present illness. He was evaluated in the context of the global COVID-19 pandemic, which necessitated consideration that the patient might be at risk for infection with the SARS-CoV-2 virus that causes COVID-19. Institutional protocols and algorithms that pertain to the evaluation of patients at risk for COVID-19 are in a state of rapid change based on information released by regulatory bodies including the CDC and federal and state organizations. These policies and algorithms were followed during the patient's care in the ED.  ____________________________________________   FINAL CLINICAL IMPRESSION(S) / ED DIAGNOSES  Gastroenteritis   Harvest Dark, MD 01/02/20 1342

## 2022-06-26 ENCOUNTER — Emergency Department: Payer: BC Managed Care – PPO

## 2022-06-26 ENCOUNTER — Other Ambulatory Visit: Payer: Self-pay

## 2022-06-26 ENCOUNTER — Emergency Department
Admission: EM | Admit: 2022-06-26 | Discharge: 2022-06-26 | Disposition: A | Discharge status: Home / Self Care | Payer: BC Managed Care – PPO | Attending: Emergency Medicine | Admitting: Emergency Medicine

## 2022-06-26 DIAGNOSIS — J069 Acute upper respiratory infection, unspecified: Secondary | ICD-10-CM | POA: Insufficient documentation

## 2022-06-26 DIAGNOSIS — Z20822 Contact with and (suspected) exposure to covid-19: Secondary | ICD-10-CM | POA: Diagnosis not present

## 2022-06-26 DIAGNOSIS — R059 Cough, unspecified: Secondary | ICD-10-CM | POA: Diagnosis present

## 2022-06-26 LAB — RESP PANEL BY RT-PCR (FLU A&B, COVID) ARPGX2
Influenza A by PCR: NEGATIVE
Influenza B by PCR: NEGATIVE
SARS Coronavirus 2 by RT PCR: NEGATIVE

## 2022-06-26 MED ORDER — PSEUDOEPH-BROMPHEN-DM 30-2-10 MG/5ML PO SYRP: 5.0000 mL | ORAL_SOLUTION | Freq: Four times a day (QID) | ORAL | 0 refills | Status: DC | PRN

## 2022-06-26 MED ORDER — BENZONATATE 100 MG PO CAPS: ORAL_CAPSULE | ORAL | 0 refills | Status: DC

## 2022-06-26 NOTE — ED Notes (Signed)
24 yom with a c/c of cough and drainage since Monday but has gotten worse since Tuesday. Pt's employer worried the pt may have covid.

## 2022-06-26 NOTE — ED Provider Notes (Signed)
[facility] Emergency Department Provider Note     Event Date/Time   First MD Initiated Contact with Patient 06/26/22 1316     (approximate)   History   Cough   HPI  Brandon Cruz is a 36 y.o. male presents to the ED for evaluation of cough since Monday.  Patient denies any intermittent fevers or chills.  He reports feeling somewhat better but aware that he may have COVID.  He also is requesting a work note for RTW.  He reports intermittent productive cough, but denies any shortness of breath or chest pain.   Physical Exam   Triage Vital Signs: ED Triage Vitals [06/26/22 1230]  Enc Vitals Group     BP (!) 150/89     Pulse Rate 73     Resp 18     Temp 97.7 F (36.5 C)     Temp Source Oral     SpO2 97 %     Weight 240 lb (108.9 kg)     Height 5\' 9"  (1.753 m)     Head Circumference      Peak Flow      Pain Score 0     Pain Loc      Pain Edu?      Excl. in GC?     Most recent vital signs: Vitals:   06/26/22 1230  BP: (!) 150/89  Pulse: 73  Resp: 18  Temp: 97.7 F (36.5 C)  SpO2: 97%    General Awake, no distress. NAD CV:  Good peripheral perfusion.  RESP:  Normal effort.  ABD:  No distention.    ED Results / Procedures / Treatments   Labs (all labs ordered are listed, but only abnormal results are displayed) Labs Reviewed  RESP PANEL BY RT-PCR (FLU A&B, COVID) [id number]     EKG    RADIOLOGY  I personally viewed and evaluated these images as part of my medical decision making, as well as reviewing the written report by the radiologist.  ED Provider Interpretation: no acute findings}  DG Chest 2 View  Result Date: 06/26/2022 CLINICAL DATA:  cough x 1+ weeks EXAM: CHEST - 2 VIEW COMPARISON:  None Available. FINDINGS: The cardiomediastinal silhouette is within normal limits. There is no focal airspace consolidation. There is no pleural effusion. No pneumothorax. There is no acute osseous abnormality. Mild thoracic  spondylosis. IMPRESSION: No evidence of acute cardiopulmonary disease. Electronically Signed   By: 06/28/2022 M.D.   On: 06/26/2022 13:49     PROCEDURES:  Critical Care performed: No  Procedures   MEDICATIONS ORDERED IN ED: Medications - No data to display   IMPRESSION / MDM / ASSESSMENT AND PLAN / ED COURSE  I reviewed the triage vital signs and the nursing notes.                              Differential diagnosis includes, but is not limited to, COVID, flu, CAP, viral URI  Patient's presentation is most consistent with acute complicated illness / injury requiring diagnostic workup.  Patient to the ED for evaluation of 3 to 4 days with a minimally productive cough.  He is evaluated for his complaint in the ED, and found to have a reassuring work-up.  No evidence of acute respiratory distress or dehydration.  No radiologic evidence of any acute infectious process on his chest x-ray.  His viral panel test is also  negative at this time.  Patient's diagnosis is consistent with viral URI with cough. Patient will be discharged home with prescriptions for Tessalon Perles and Bromfed syrup. Patient is to follow up with his primary provider or local urgent care as needed or otherwise directed. Patient is given ED precautions to return to the ED for any worsening or new symptoms.   FINAL CLINICAL IMPRESSION(S) / ED DIAGNOSES   Final diagnoses:  Viral URI with cough     Rx / DC Orders   ED Discharge Orders          Ordered    benzonatate (TESSALON PERLES) 100 MG capsule        06/26/22 1410    brompheniramine-pseudoephedrine-DM 30-2-10 MG/5ML syrup  4 times daily PRN        06/26/22 1410             Note:  This document was prepared using Dragon voice recognition software and may include unintentional dictation errors.    Melvenia Needles, PA-C 06/26/22 1520    Blake Divine, MD 06/26/22 1944

## 2022-06-26 NOTE — Discharge Instructions (Addendum)
Take the prescription meds as directed. Consider OTC Mucinex for additional congestion relief. Follow-up with one of the local urgent care centers, as needed.

## 2022-06-26 NOTE — ED Triage Notes (Signed)
Pt states he has had a cough since this past Monday and called out of work. Pt states he is feeling better but is worried that he might have covid and needs a note to go back to work. Pt states he sometimes brings up mucous with his cough, denies fever and SOB.

## 2022-11-02 ENCOUNTER — Emergency Department: Payer: BC Managed Care – PPO

## 2022-11-02 ENCOUNTER — Other Ambulatory Visit: Payer: Self-pay

## 2022-11-02 ENCOUNTER — Encounter: Payer: Self-pay | Admitting: Emergency Medicine

## 2022-11-02 ENCOUNTER — Emergency Department
Admission: EM | Admit: 2022-11-02 | Discharge: 2022-11-02 | Disposition: A | Discharge status: Home / Self Care | Payer: BC Managed Care – PPO | Attending: Emergency Medicine | Admitting: Emergency Medicine

## 2022-11-02 DIAGNOSIS — N132 Hydronephrosis with renal and ureteral calculous obstruction: Secondary | ICD-10-CM | POA: Diagnosis not present

## 2022-11-02 DIAGNOSIS — R1032 Left lower quadrant pain: Secondary | ICD-10-CM | POA: Diagnosis present

## 2022-11-02 DIAGNOSIS — N200 Calculus of kidney: Secondary | ICD-10-CM

## 2022-11-02 LAB — CBC WITH DIFFERENTIAL/PLATELET
Abs Immature Granulocytes: 0.1 10*3/uL — ABNORMAL HIGH (ref 0.00–0.07)
Basophils Absolute: 0.1 10*3/uL (ref 0.0–0.1)
Basophils Relative: 0 %
Eosinophils Absolute: 0 10*3/uL (ref 0.0–0.5)
Eosinophils Relative: 0 %
HCT: 49.4 % (ref 39.0–52.0)
Hemoglobin: 17 g/dL (ref 13.0–17.0)
Immature Granulocytes: 1 %
Lymphocytes Relative: 10 %
Lymphs Abs: 1.8 10*3/uL (ref 0.7–4.0)
MCH: 29.4 pg (ref 26.0–34.0)
MCHC: 34.4 g/dL (ref 30.0–36.0)
MCV: 85.3 fL (ref 80.0–100.0)
Monocytes Absolute: 2.1 10*3/uL — ABNORMAL HIGH (ref 0.1–1.0)
Monocytes Relative: 11 %
Neutro Abs: 14.6 10*3/uL — ABNORMAL HIGH (ref 1.7–7.7)
Neutrophils Relative %: 78 %
Platelets: 154 10*3/uL (ref 150–400)
RBC: 5.79 MIL/uL (ref 4.22–5.81)
RDW: 12.9 % (ref 11.5–15.5)
WBC: 18.7 10*3/uL — ABNORMAL HIGH (ref 4.0–10.5)
nRBC: 0 % (ref 0.0–0.2)

## 2022-11-02 LAB — COMPREHENSIVE METABOLIC PANEL
ALT: 23 U/L (ref 0–44)
AST: 26 U/L (ref 15–41)
Albumin: 4.1 g/dL (ref 3.5–5.0)
Alkaline Phosphatase: 55 U/L (ref 38–126)
Anion gap: 5 (ref 5–15)
BUN: 22 mg/dL — ABNORMAL HIGH (ref 6–20)
CO2: 24 mmol/L (ref 22–32)
Calcium: 8.8 mg/dL — ABNORMAL LOW (ref 8.9–10.3)
Chloride: 106 mmol/L (ref 98–111)
Creatinine, Ser: 1.42 mg/dL — ABNORMAL HIGH (ref 0.61–1.24)
GFR, Estimated: >60 mL/min (ref >60)
Glucose, Bld: 121 mg/dL — ABNORMAL HIGH (ref 70–99)
Potassium: 4.2 mmol/L (ref 3.5–5.1)
Sodium: 135 mmol/L (ref 135–145)
Total Bilirubin: 2.3 mg/dL — ABNORMAL HIGH (ref 0.3–1.2)
Total Protein: 7.9 g/dL (ref 6.5–8.1)

## 2022-11-02 LAB — URINALYSIS, ROUTINE W REFLEX MICROSCOPIC
Bacteria, UA: NONE SEEN
Bilirubin Urine: NEGATIVE
Glucose, UA: NEGATIVE mg/dL
Ketones, ur: NEGATIVE mg/dL
Leukocytes,Ua: NEGATIVE
Nitrite: NEGATIVE
Protein, ur: NEGATIVE mg/dL
Specific Gravity, Urine: 1.026 (ref 1.005–1.030)
Squamous Epithelial / HPF: NONE SEEN /HPF (ref 0–5)
pH: 5 (ref 5.0–8.0)

## 2022-11-02 LAB — CBC
HCT: 49.7 % (ref 39.0–52.0)
Hemoglobin: 17.1 g/dL — ABNORMAL HIGH (ref 13.0–17.0)
MCH: 29.4 pg (ref 26.0–34.0)
MCHC: 34.4 g/dL (ref 30.0–36.0)
MCV: 85.4 fL (ref 80.0–100.0)
Platelets: 153 10*3/uL (ref 150–400)
RBC: 5.82 MIL/uL — ABNORMAL HIGH (ref 4.22–5.81)
RDW: 12.8 % (ref 11.5–15.5)
WBC: 19.5 10*3/uL — ABNORMAL HIGH (ref 4.0–10.5)
nRBC: 0 % (ref 0.0–0.2)

## 2022-11-02 LAB — LIPASE, BLOOD: Lipase: 32 U/L (ref 11–51)

## 2022-11-02 MED ORDER — OXYCODONE HCL 5 MG PO TABS: 5.0000 mg | ORAL_TABLET | Freq: Three times a day (TID) | ORAL | 0 refills | Status: DC | PRN

## 2022-11-02 MED ORDER — SODIUM CHLORIDE 0.9 % IV BOLUS
1000.0000 mL | Freq: Once | INTRAVENOUS | Status: AC
  Administered 2022-11-02: 1000 mL via INTRAVENOUS

## 2022-11-02 MED ORDER — KETOROLAC TROMETHAMINE 15 MG/ML IJ SOLN
15.0000 mg | Freq: Once | INTRAMUSCULAR | Status: AC
  Administered 2022-11-02: 15 mg via INTRAVENOUS
  Filled 2022-11-02: qty 1

## 2022-11-02 MED ORDER — ONDANSETRON HCL 4 MG/2ML IJ SOLN
4.0000 mg | Freq: Once | INTRAMUSCULAR | Status: AC
  Administered 2022-11-02: 4 mg via INTRAVENOUS
  Filled 2022-11-02: qty 2

## 2022-11-02 MED ORDER — TAMSULOSIN HCL 0.4 MG PO CAPS: 0.4000 mg | ORAL_CAPSULE | Freq: Every day | ORAL | 0 refills | Status: AC

## 2022-11-02 NOTE — ED Provider Notes (Signed)
[facility] Provider Note    Event Date/Time   First MD Initiated Contact with Patient 11/02/22 [phone]     (approximate)   History   Flank Pain   HPI  Zailyn Rowser is a 37 y.o. male   Past medical history of no significant past medical history presents the present department with left flank pain onset 2 days ago that was transient radiating down to his groin that recurred and severe this morning.  No traumatic inciting event.  No history of kidney stones.  No dysuria or penile discharge.  No testicular pain.  No fever or chills.  No other acute medical complaints.   External Medical Documents Reviewed: Emergency department visit from September 2023 for viral URI symptoms, the noting no significant past medical history then as well.      Physical Exam   Triage Vital Signs: ED Triage Vitals [11/02/22 0818]  Enc Vitals Group     BP (!) 165/100     Pulse Rate 89     Resp 20     Temp 98.4 F (36.9 C)     Temp src      SpO2 96 %     Weight      Height      Head Circumference      Peak Flow      Pain Score      Pain Loc      Pain Edu?      Excl. in [place]?     Most recent vital signs: Vitals:   11/02/22 0818  BP: (!) 165/100  Pulse: 89  Resp: 20  Temp: 98.4 F (36.9 C)  SpO2: 96%    General: Awake, no distress.  CV:  Good peripheral perfusion.  Resp:  Normal effort.  Abd:  No distention.  Other:  Soft nontender abdomen, no CVA tenderness, appears nontoxic with vital signs significant only for some hypertension 160/100 and afebrile.  Testicles appear normal with normal lie, soft, nontender   ED Results / Procedures / Treatments   Labs (all labs ordered are listed, but only abnormal results are displayed) Labs Reviewed  COMPREHENSIVE METABOLIC PANEL - Abnormal; Notable for the following components:      Result Value   Glucose, Bld 121 (*)    BUN 22 (*)    Creatinine, Ser 1.42 (*)    Calcium 8.8 (*)    Total Bilirubin 2.3 (*)     All other components within normal limits  CBC - Abnormal; Notable for the following components:   WBC 19.5 (*)    RBC 5.82 (*)    Hemoglobin 17.1 (*)    All other components within normal limits  URINALYSIS, ROUTINE W REFLEX MICROSCOPIC - Abnormal; Notable for the following components:   Color, Urine YELLOW (*)    APPearance CLEAR (*)    Hgb urine dipstick MODERATE (*)    All other components within normal limits  CBC WITH DIFFERENTIAL/PLATELET - Abnormal; Notable for the following components:   WBC 18.7 (*)    Neutro Abs 14.6 (*)    Monocytes Absolute 2.1 (*)    Abs Immature Granulocytes 0.10 (*)    All other components within normal limits  LIPASE, BLOOD     I ordered and reviewed the above labs they are notable for increased white blood cell count at 19 but no bacteria or or inflammatory changes in the urine suggestive of urinary tract infection, mildly elevated creatinine.   RADIOLOGY I  independently reviewed and interpreted CT of the abdomen pelvis since he has punctate hyperdensity in the left lower quadrant adjacent to the bladder   PROCEDURES:  Critical Care performed: No  Procedures   MEDICATIONS ORDERED IN ED: Medications  ketorolac (TORADOL) 15 MG/ML injection 15 mg (15 mg Intravenous Given 11/02/22 0850)  ondansetron (ZOFRAN) injection 4 mg (4 mg Intravenous Given 11/02/22 0850)  sodium chloride 0.9 % bolus 1,000 mL (1,000 mLs Intravenous New Bag/Given 11/02/22 0850)    IMPRESSION / MDM / [place] / ED COURSE  I reviewed the triage vital signs and the nursing notes.                                Patient's presentation is most consistent with acute presentation with potential threat to life or bodily function.  Differential diagnosis includes, but is not limited to, renal colic, nephrolithiasis, urinary tract infection pyelonephritis, testicular torsion, intra-abdominal infection or obstruction   MDM: Patient with flank pain most likely  renal colic nephrolithiasis we will obtain a CT scan renal protocol as well as basic labs to check for kidney function,, urinalysis.  Pain control with IV Toradol, IV fluids and IV antiemetic.  I considered hospitalization for admission or observation given pain controlled and no evidence of large stone, no evidence of urinary tract infection and stable vital signs, outpatient treatment and follow-up most appropriate at this time.  Return precautions given.        FINAL CLINICAL IMPRESSION(S) / ED DIAGNOSES   Final diagnoses:  Kidney stone     Rx / DC Orders   ED Discharge Orders          Ordered    tamsulosin (FLOMAX) 0.4 MG CAPS capsule  Daily        11/02/22 0923    oxyCODONE (ROXICODONE) 5 MG immediate release tablet  Every 8 hours PRN        11/02/22 4403             Note:  This document was prepared using Dragon voice recognition software and may include unintentional dictation errors.    Lucillie Garfinkel, MD 11/02/22 1028

## 2022-11-02 NOTE — Discharge Instructions (Addendum)
Take Flomax as prescribed. Take acetaminophen 650 mg and ibuprofen 400 mg every 6 hours for pain.  Take with food. If you have severe/breakthrough pain despite taking these medicines, you may take oxycodone as prescribed.  This is a narcotic pain medication that may make you feel sleepy so be aware not to drive or operate machinery when taking this medicine.  Follow-up with your regular doctor or call Dr. Abner Greenspan of urology for a follow-up appointment.  Thank you for choosing [place] for your health care today!  Please see your primary doctor this week for a follow up appointment.   Sometimes, in the early stages of certain disease courses it is difficult to detect in the emergency department evaluation -- so, it is important that you continue to monitor your symptoms and call your doctor right away or return to the emergency department if you develop any new or worsening symptoms.  Please go to the following website to schedule new (and existing) patient appointments:   [email or web address]  If you do not have a primary doctor try calling the following clinics to establish care:  If you have insurance:  [facility] [phone] [place] [address]   [facility]  [phone] [address] [place] [address]   If you do not have insurance:  [facility]  [phone] [address] [place] [address]   The following is another list of primary care offices in the area who are accepting new patients at this time.  Please reach out to one of them directly and let them know you would like to schedule an appointment to follow up on an Emergency Department visit, and/or to establish a new primary care provider (PCP).  There are likely other primary care clinics in the are who are accepting new patients, but this is an excellent place to start:  [place] physician: Dr Lavon Paganini [address] [place], [place] [address] [phone]  [facility] Lead Physician: Dr Steele Sizer [address], [place], [place] [address] [phone]  [place] Physician: Dr Park Liter [address] [place], [place] [address] [phone]  [facility] Lead Physician: Dr Dewaine Oats [place], [place], [place] [address] [phone]  [facility] Physician: Dr Halina Maidens [address] [place], [place] [address] [phone]   It was my pleasure to care for you today.   Hoover Brunette Jacelyn Grip, MD

## 2022-11-02 NOTE — ED Triage Notes (Signed)
Pt via POV from home. Pt c/o L sided flank pain that started on Friday morning. States Saturday morning it went away but this AM it is the worse it has ever been. Denies hx of kidney stones. Pt endorses NV on Friday but none today. Denies fever. States he does not feel he is emptying his bladder completely. Pt is A&Ox4 but seems uncomfortable during triage.

## 2022-11-09 ENCOUNTER — Other Ambulatory Visit: Payer: Self-pay

## 2022-11-09 ENCOUNTER — Emergency Department
Admission: EM | Admit: 2022-11-09 | Discharge: 2022-11-09 | Disposition: A | Discharge status: Home / Self Care | Payer: BC Managed Care – PPO | Attending: Emergency Medicine | Admitting: Emergency Medicine

## 2022-11-09 ENCOUNTER — Encounter: Payer: Self-pay | Admitting: Emergency Medicine

## 2022-11-09 DIAGNOSIS — Z0279 Encounter for issue of other medical certificate: Secondary | ICD-10-CM | POA: Insufficient documentation

## 2022-11-09 DIAGNOSIS — Z7689 Persons encountering health services in other specified circumstances: Secondary | ICD-10-CM

## 2022-11-09 NOTE — ED Notes (Signed)
See triage note  Presents requesting a return to work note.  States he was seen last week for renal stone  thinks he passed the stone on Thursday  But work needs an up to date note  Denies any pain at present

## 2022-11-09 NOTE — ED Notes (Signed)
Pt roomed, pt ambulatory with steady gait, respirations unlabored. Provider will discuss requested note with pt.

## 2022-11-09 NOTE — ED Triage Notes (Signed)
Pt via POV from home. Pt was seen on 1/28 for kidney stone and states that he needs a return to work note, states that he was seen but was out all week but wants to return back to work tomorrow. Pt is A&Ox4 and NAD

## 2022-11-09 NOTE — ED Provider Notes (Signed)
[facility] Provider Note    Event Date/Time   First MD Initiated Contact with Patient 11/09/22 [phone]     (approximate)   History   Letter for School/Work   HPI  Brandon Cruz is a 37 y.o. male   presents to the ED for a note to return to work tomorrow.  Patient was seen in the emergency department on 11/02/2022 at which time he was diagnosed with a kidney stone.  The initial work note was written for him to return to work on 11/04/2022.  He states that he did not return on that day as he was still taking some pain medication and did not believe that he had passed his kidney stone.  He reports that work will not let him return to work until he has a note that says he is medically cleared.  He denies any difficulty at this time and was not aware that he was supposed to follow-up with a urologist.      Physical Exam   Triage Vital Signs: ED Triage Vitals  Enc Vitals Group     BP 11/09/22 0725 123/86     Pulse Rate 11/09/22 0725 78     Resp 11/09/22 0725 18     Temp 11/09/22 0725 97.8 F (36.6 C)     Temp Source 11/09/22 0725 Oral     SpO2 11/09/22 0725 97 %     Weight --      Height --      Head Circumference --      Peak Flow --      Pain Score 11/09/22 0718 0     Pain Loc --      Pain Edu? --      Excl. in [place]? --     Most recent vital signs: Vitals:   11/09/22 0725  BP: 123/86  Pulse: 78  Resp: 18  Temp: 97.8 F (36.6 C)  SpO2: 97%     General: Awake, no distress.  CV:  Good peripheral perfusion.  Resp:  Normal effort.  Abd:  No distention.  Other:     ED Results / Procedures / Treatments   Labs (all labs ordered are listed, but only abnormal results are displayed) Labs Reviewed - No data to display     PROCEDURES:  Critical Care performed:   Procedures   MEDICATIONS ORDERED IN ED: Medications - No data to display   IMPRESSION / MDM / [place] / ED COURSE  I reviewed the triage vital signs and the  nursing notes.   Differential diagnosis includes, but is not limited to, evaluation for return to work.  37 year old male presents to the ED for return to work note.  He was seen in the emergency department for a kidney stone which is no longer giving him any problems.  He needs a note saying that he has been medically cleared to return to work tomorrow.  I explained to him that we do not backdate notes however I can write a note saying he was seen today in the emergency department and that medically he is cleared to return to work.  He is agreeable with that.      Patient's presentation is most consistent with acute, uncomplicated illness.  FINAL CLINICAL IMPRESSION(S) / ED DIAGNOSES   Final diagnoses:  Return to work evaluation     Rx / DC Orders   ED Discharge Orders     None  Note:  This document was prepared using Dragon voice recognition software and may include unintentional dictation errors.   Johnn Hai, PA-C 11/09/22 0092    Blake Divine, MD 11/09/22 [phone]

## 2023-12-15 ENCOUNTER — Emergency Department
Admission: EM | Admit: 2023-12-15 | Discharge: 2023-12-15 | Disposition: A | Discharge status: Home / Self Care | Payer: PRIVATE HEALTH INSURANCE | Attending: Emergency Medicine | Admitting: Emergency Medicine

## 2023-12-15 ENCOUNTER — Other Ambulatory Visit: Payer: Self-pay

## 2023-12-15 ENCOUNTER — Emergency Department: Payer: PRIVATE HEALTH INSURANCE

## 2023-12-15 DIAGNOSIS — E119 Type 2 diabetes mellitus without complications: Secondary | ICD-10-CM | POA: Insufficient documentation

## 2023-12-15 DIAGNOSIS — D72829 Elevated white blood cell count, unspecified: Secondary | ICD-10-CM | POA: Insufficient documentation

## 2023-12-15 DIAGNOSIS — L03115 Cellulitis of right lower limb: Secondary | ICD-10-CM | POA: Diagnosis not present

## 2023-12-15 DIAGNOSIS — M79671 Pain in right foot: Secondary | ICD-10-CM

## 2023-12-15 LAB — COMPREHENSIVE METABOLIC PANEL
ALT: 32 U/L (ref 0–44)
AST: 24 U/L (ref 15–41)
Albumin: 4.4 g/dL (ref 3.5–5.0)
Alkaline Phosphatase: 54 U/L (ref 38–126)
Anion gap: 9 (ref 5–15)
BUN: 12 mg/dL (ref 6–20)
CO2: 26 mmol/L (ref 22–32)
Calcium: 9.5 mg/dL (ref 8.9–10.3)
Chloride: 104 mmol/L (ref 98–111)
Creatinine, Ser: 0.9 mg/dL (ref 0.61–1.24)
GFR, Estimated: >60 mL/min (ref >60)
Glucose, Bld: 117 mg/dL — ABNORMAL HIGH (ref 70–99)
Potassium: 3.8 mmol/L (ref 3.5–5.1)
Sodium: 139 mmol/L (ref 135–145)
Total Bilirubin: 1 mg/dL (ref 0.0–1.2)
Total Protein: 7.7 g/dL (ref 6.5–8.1)

## 2023-12-15 LAB — CBC WITH DIFFERENTIAL/PLATELET
Abs Immature Granulocytes: 0.04 10*3/uL (ref 0.00–0.07)
Basophils Absolute: 0 10*3/uL (ref 0.0–0.1)
Basophils Relative: 0 %
Eosinophils Absolute: 0.2 10*3/uL (ref 0.0–0.5)
Eosinophils Relative: 2 %
HCT: 53.2 % — ABNORMAL HIGH (ref 39.0–52.0)
Hemoglobin: 18.2 g/dL — ABNORMAL HIGH (ref 13.0–17.0)
Immature Granulocytes: 0 %
Lymphocytes Relative: 18 %
Lymphs Abs: 2.1 10*3/uL (ref 0.7–4.0)
MCH: 30 pg (ref 26.0–34.0)
MCHC: 34.2 g/dL (ref 30.0–36.0)
MCV: 87.8 fL (ref 80.0–100.0)
Monocytes Absolute: 0.9 10*3/uL (ref 0.1–1.0)
Monocytes Relative: 8 %
Neutro Abs: 8.1 10*3/uL — ABNORMAL HIGH (ref 1.7–7.7)
Neutrophils Relative %: 72 %
Platelets: 169 10*3/uL (ref 150–400)
RBC: 6.06 MIL/uL — ABNORMAL HIGH (ref 4.22–5.81)
RDW: 13 % (ref 11.5–15.5)
WBC: 11.4 10*3/uL — ABNORMAL HIGH (ref 4.0–10.5)
nRBC: 0 % (ref 0.0–0.2)

## 2023-12-15 LAB — URIC ACID: Uric Acid, Serum: 7.3 mg/dL (ref 3.7–8.6)

## 2023-12-15 LAB — HEMOGLOBIN A1C
Hgb A1c MFr Bld: 5 % (ref 4.8–5.6)
Mean Plasma Glucose: 96.8 mg/dL

## 2023-12-15 MED ORDER — DOXYCYCLINE HYCLATE 100 MG PO TABS
100.0000 mg | ORAL_TABLET | Freq: Once | ORAL | Status: AC
  Administered 2023-12-15: 100 mg via ORAL
  Filled 2023-12-15: qty 1

## 2023-12-15 MED ORDER — OXYCODONE-ACETAMINOPHEN 5-325 MG PO TABS
1.0000 | ORAL_TABLET | Freq: Once | ORAL | Status: AC
  Administered 2023-12-15: 1 via ORAL
  Filled 2023-12-15: qty 1

## 2023-12-15 MED ORDER — OXYCODONE-ACETAMINOPHEN 5-325 MG PO TABS: 0.5000 | ORAL_TABLET | Freq: Three times a day (TID) | ORAL | 0 refills | Status: AC | PRN

## 2023-12-15 MED ORDER — DOXYCYCLINE HYCLATE 100 MG PO TABS: 100.0000 mg | ORAL_TABLET | Freq: Two times a day (BID) | ORAL | 0 refills | Status: AC

## 2023-12-15 NOTE — ED Provider Notes (Signed)
 [facility] Provider Note   Event Date/Time   First MD Initiated Contact with Patient 12/15/23 1202     (approximate) History  Foot Pain  HPI Brandon Cruz is a 38 y.o. male with stated past family history of diabetes and gout presents complaining of of erythema, swelling, painful ambulation over the right lateral ankle and dorsum of the foot.  Patient states that the symptoms been present for approximately 1 week and are improved with a tight bandage around the site.  Patient denies any recent bug bites, injuries, or defects of the skin overlying this area prior to this erythema ROS: Patient currently denies any vision changes, tinnitus, difficulty speaking, facial droop, sore throat, chest pain, shortness of breath, abdominal pain, nausea/vomiting/diarrhea, dysuria, or weakness/numbness/paresthesias in any extremity   Physical Exam  Triage Vital Signs: ED Triage Vitals  Encounter Vitals Group     BP 12/15/23 1047 (!) 143/98     Systolic BP Percentile --      Diastolic BP Percentile --      Pulse Rate 12/15/23 1047 75     Resp 12/15/23 1047 18     Temp 12/15/23 1047 97.8 F (36.6 C)     Temp Source 12/15/23 1047 Oral     SpO2 12/15/23 1047 100 %     Weight 12/15/23 1048 240 lb (108.9 kg)     Height 12/15/23 1048 5\' 9"  (1.753 m)     Head Circumference --      Peak Flow --      Pain Score 12/15/23 1101 8     Pain Loc --      Pain Education --      Exclude from Growth Chart --    Most recent vital signs: Vitals:   12/15/23 1047 12/15/23 1415  BP: (!) 143/98 129/88  Pulse: 75 72  Resp: 18 20  Temp: 97.8 F (36.6 C) 98.9 F (37.2 C)  SpO2: 100% 99%   General: Awake, oriented x4. CV:  Good peripheral perfusion.  Resp:  Normal effort.  Abd:  No distention.  Other:  Middle-aged obese Caucasian male resting comfortably in no acute distress.  Erythema, edema, and tenderness to palpation over the dorsal lateral aspect of the right ankle ED Results  / Procedures / Treatments  Labs (all labs ordered are listed, but only abnormal results are displayed) Labs Reviewed  CBC WITH DIFFERENTIAL/PLATELET - Abnormal; Notable for the following components:      Result Value   WBC 11.4 (*)    RBC 6.06 (*)    Hemoglobin 18.2 (*)    HCT 53.2 (*)    Neutro Abs 8.1 (*)    All other components within normal limits  COMPREHENSIVE METABOLIC PANEL - Abnormal; Notable for the following components:   Glucose, Bld 117 (*)    All other components within normal limits  URIC ACID  HEMOGLOBIN A1C   RADIOLOGY ED MD interpretation: X-ray of the right foot independently interpreted and shows no evidence of acute abnormalities -Agree with radiology assessment Official radiology report(s): DG Foot Complete Right Result Date: 12/15/2023 CLINICAL DATA:  Ankle pain.  No known injury. EXAM: RIGHT FOOT COMPLETE - 3+ VIEW COMPARISON:  None Available. FINDINGS: No acute fracture or dislocation. No aggressive osseous lesion. Mild hallux valgus deformity noted. No significant arthritis of the imaged joints. Calcaneal spur noted along the Achilles tendon attachment site. No focal soft tissue swelling. No radiopaque foreign bodies. IMPRESSION: No acute osseous abnormality of the  right foot. Electronically Signed   By: Jules Schick M.D.   On: 12/15/2023 14:22   PROCEDURES: Critical Care performed: No Procedures MEDICATIONS ORDERED IN ED: Medications  oxyCODONE-acetaminophen (PERCOCET/ROXICET) 5-325 MG per tablet 1 tablet (1 tablet Oral Given 12/15/23 1427)  doxycycline (VIBRA-TABS) tablet 100 mg (100 mg Oral Given 12/15/23 1427)   IMPRESSION / MDM / ASSESSMENT AND PLAN / ED COURSE  I reviewed the triage vital signs and the nursing notes.                             The patient is on the cardiac monitor to evaluate for evidence of arrhythmia and/or significant heart rate changes. Patient's presentation is most consistent with acute presentation with potential threat to  life or bodily function. 38 year old male presents for right dorsal foot pain. Given history, exam and workup I have low suspicion for fracture, dislocation, significant ligamentous injury, septic arthritis, gout flare, new autoimmune arthropathy, or gonococcal arthropathy.  Interventions: CBC, CMP, and x-ray of the right foot only significant for leukocytosis to 11  Given patient does not have a PCP and does have elevated white count at this time, we will place him empirically on doxycycline for possibility of cellulitis however encouraged close follow-up and given referral for PCP. Disposition: Discharge home with strict return precautions and instructions for prompt primary care follow up in the next week.   FINAL CLINICAL IMPRESSION(S) / ED DIAGNOSES   Final diagnoses:  Right foot pain  Cellulitis of right foot   Rx / DC Orders   ED Discharge Orders          Ordered    Ambulatory Referral to Primary Care (Establish Care)        12/15/23 1440    oxyCODONE-acetaminophen (PERCOCET) 5-325 MG tablet  Every 8 hours PRN        12/15/23 1440    doxycycline (VIBRA-TABS) 100 MG tablet  2 times daily        12/15/23 1440           Note:  This document was prepared using Dragon voice recognition software and may include unintentional dictation errors.   Merwyn Katos, MD 12/15/23 [phone]

## 2023-12-15 NOTE — ED Notes (Signed)
 See triage notes. Patient c/o right foot pain that started on Monday morning. Patient believes it could be gout and reports a family history of same.

## 2023-12-15 NOTE — ED Triage Notes (Signed)
 Pt to ED via POV from home. Pt reports Monday morning started having right foot pain that has gotten worse. Pt reports may be gout but has not been diagnosed with it. 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 Pt has brace on ankle.
